# Patient Record
Sex: Male | Born: 1940 | Race: White | Hispanic: No | Marital: Single | Smoking: Former smoker
Health system: Southern US, Community
[De-identification: ages and names within clinical notes are randomized; demographics above are authoritative.]

## PROBLEM LIST (undated history)

## (undated) DIAGNOSIS — M1990 Unspecified osteoarthritis, unspecified site: Secondary | ICD-10-CM

## (undated) DIAGNOSIS — G459 Transient cerebral ischemic attack, unspecified: Secondary | ICD-10-CM

## (undated) DIAGNOSIS — G4700 Insomnia, unspecified: Secondary | ICD-10-CM

## (undated) DIAGNOSIS — I6529 Occlusion and stenosis of unspecified carotid artery: Secondary | ICD-10-CM

## (undated) DIAGNOSIS — I639 Cerebral infarction, unspecified: Secondary | ICD-10-CM

## (undated) DIAGNOSIS — K759 Inflammatory liver disease, unspecified: Secondary | ICD-10-CM

## (undated) DIAGNOSIS — E7800 Pure hypercholesterolemia, unspecified: Secondary | ICD-10-CM

## (undated) DIAGNOSIS — M109 Gout, unspecified: Secondary | ICD-10-CM

## (undated) DIAGNOSIS — I10 Essential (primary) hypertension: Secondary | ICD-10-CM

## (undated) DIAGNOSIS — S22000A Wedge compression fracture of unspecified thoracic vertebra, initial encounter for closed fracture: Secondary | ICD-10-CM

## (undated) DIAGNOSIS — M810 Age-related osteoporosis without current pathological fracture: Secondary | ICD-10-CM

## (undated) DIAGNOSIS — I099 Rheumatic heart disease, unspecified: Secondary | ICD-10-CM

## (undated) DIAGNOSIS — G43909 Migraine, unspecified, not intractable, without status migrainosus: Secondary | ICD-10-CM

## (undated) DIAGNOSIS — K219 Gastro-esophageal reflux disease without esophagitis: Secondary | ICD-10-CM

## (undated) DIAGNOSIS — G629 Polyneuropathy, unspecified: Secondary | ICD-10-CM

## (undated) DIAGNOSIS — J449 Chronic obstructive pulmonary disease, unspecified: Secondary | ICD-10-CM

## (undated) DIAGNOSIS — K56609 Unspecified intestinal obstruction, unspecified as to partial versus complete obstruction: Secondary | ICD-10-CM

## (undated) HISTORY — DX: Unspecified osteoarthritis, unspecified site: M19.90

## (undated) HISTORY — PX: ENDARTERECTOMY: SHX5162

## (undated) HISTORY — DX: Pure hypercholesterolemia, unspecified: E78.00

## (undated) HISTORY — DX: Insomnia, unspecified: G47.00

## (undated) HISTORY — DX: Rheumatic heart disease, unspecified: I09.9

## (undated) HISTORY — DX: Migraine, unspecified, not intractable, without status migrainosus: G43.909

## (undated) HISTORY — DX: Age-related osteoporosis without current pathological fracture: M81.0

## (undated) HISTORY — DX: Chronic obstructive pulmonary disease, unspecified: J44.9

## (undated) HISTORY — DX: Essential (primary) hypertension: I10

## (undated) HISTORY — DX: Unspecified intestinal obstruction, unspecified as to partial versus complete obstruction: K56.609

## (undated) HISTORY — DX: Wedge compression fracture of unspecified thoracic vertebra, initial encounter for closed fracture: S22.000A

## (undated) HISTORY — DX: Gastro-esophageal reflux disease without esophagitis: K21.9

## (undated) HISTORY — PX: TONSILLECTOMY: SUR1361

## (undated) HISTORY — DX: Cerebral infarction, unspecified: I63.9

## (undated) HISTORY — DX: Polyneuropathy, unspecified: G62.9

## (undated) HISTORY — DX: Gout, unspecified: M10.9

## (undated) HISTORY — DX: Inflammatory liver disease, unspecified: K75.9

---

## 2004-07-06 DIAGNOSIS — I639 Cerebral infarction, unspecified: Secondary | ICD-10-CM

## 2004-07-06 HISTORY — DX: Cerebral infarction, unspecified: I63.9

## 2004-07-20 ENCOUNTER — Ambulatory Visit: Payer: Self-pay | Admitting: Physical Medicine & Rehabilitation

## 2004-07-20 ENCOUNTER — Inpatient Hospital Stay (HOSPITAL_COMMUNITY)
Admission: RE | Admit: 2004-07-20 | Discharge: 2004-08-10 | Payer: Self-pay | Admitting: Physical Medicine & Rehabilitation

## 2004-08-13 ENCOUNTER — Encounter
Admission: RE | Admit: 2004-08-13 | Discharge: 2004-11-11 | Payer: Self-pay | Admitting: Physical Medicine & Rehabilitation

## 2004-09-14 ENCOUNTER — Encounter
Admission: RE | Admit: 2004-09-14 | Discharge: 2004-12-13 | Payer: Self-pay | Admitting: Physical Medicine & Rehabilitation

## 2004-09-17 ENCOUNTER — Ambulatory Visit: Payer: Self-pay | Admitting: Physical Medicine & Rehabilitation

## 2004-10-10 ENCOUNTER — Ambulatory Visit: Payer: Self-pay | Admitting: Psychology

## 2004-11-14 ENCOUNTER — Ambulatory Visit: Payer: Self-pay | Admitting: Physical Medicine & Rehabilitation

## 2005-01-28 ENCOUNTER — Ambulatory Visit: Payer: Self-pay | Admitting: Internal Medicine

## 2005-02-05 ENCOUNTER — Ambulatory Visit: Payer: Self-pay | Admitting: Cardiology

## 2005-02-12 ENCOUNTER — Encounter
Admission: RE | Admit: 2005-02-12 | Discharge: 2005-05-13 | Payer: Self-pay | Admitting: Physical Medicine & Rehabilitation

## 2005-02-15 ENCOUNTER — Ambulatory Visit: Payer: Self-pay | Admitting: Cardiology

## 2005-03-15 ENCOUNTER — Ambulatory Visit: Payer: Self-pay | Admitting: Cardiology

## 2005-05-29 ENCOUNTER — Encounter
Admission: RE | Admit: 2005-05-29 | Discharge: 2005-08-27 | Payer: Self-pay | Admitting: Physical Medicine & Rehabilitation

## 2005-05-29 ENCOUNTER — Ambulatory Visit: Payer: Self-pay | Admitting: Physical Medicine & Rehabilitation

## 2005-10-02 ENCOUNTER — Encounter
Admission: RE | Admit: 2005-10-02 | Discharge: 2005-12-31 | Payer: Self-pay | Admitting: Physical Medicine & Rehabilitation

## 2005-10-02 ENCOUNTER — Ambulatory Visit: Payer: Self-pay | Admitting: Physical Medicine & Rehabilitation

## 2005-10-28 DIAGNOSIS — R569 Unspecified convulsions: Secondary | ICD-10-CM

## 2005-10-28 HISTORY — DX: Unspecified convulsions: R56.9

## 2005-12-19 ENCOUNTER — Ambulatory Visit: Payer: Self-pay | Admitting: Cardiology

## 2006-05-20 ENCOUNTER — Inpatient Hospital Stay (HOSPITAL_COMMUNITY): Admission: AD | Admit: 2006-05-20 | Discharge: 2006-05-22 | Payer: Self-pay | Admitting: Pediatrics

## 2006-05-20 ENCOUNTER — Encounter: Payer: Self-pay | Admitting: Emergency Medicine

## 2006-05-21 ENCOUNTER — Ambulatory Visit: Payer: Self-pay | Admitting: Cardiology

## 2015-08-12 DIAGNOSIS — G459 Transient cerebral ischemic attack, unspecified: Secondary | ICD-10-CM

## 2015-08-12 HISTORY — DX: Transient cerebral ischemic attack, unspecified: G45.9

## 2015-10-27 ENCOUNTER — Encounter: Payer: Self-pay | Admitting: Neurology

## 2015-10-27 ENCOUNTER — Ambulatory Visit (INDEPENDENT_AMBULATORY_CARE_PROVIDER_SITE_OTHER): Payer: Medicare HMO | Admitting: Neurology

## 2015-10-27 VITALS — BP 138/78 | HR 62 | Ht 72.0 in | Wt 149.1 lb

## 2015-10-27 DIAGNOSIS — H53461 Homonymous bilateral field defects, right side: Secondary | ICD-10-CM

## 2015-10-27 DIAGNOSIS — R203 Hyperesthesia: Secondary | ICD-10-CM

## 2015-10-27 DIAGNOSIS — M79671 Pain in right foot: Secondary | ICD-10-CM | POA: Diagnosis not present

## 2015-10-27 NOTE — Progress Notes (Signed)
Note routed

## 2015-10-27 NOTE — Progress Notes (Signed)
NEUROLOGY CONSULTATION NOTE  Miguel Daniels MRN: [id number] DOB: January 18, 1941  Referring provider: Dr. Woody Seller Primary care provider: Dr. 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  History obtained by patient, his sister and PCP note.  Lumbar MRI report and labs reviewed.  He has history of hemorrhagic stroke involving the left hemisphere.  He has residual hyperesthesia involving the right side of face, arm and leg.  Gradually, over the past several months, he has had increased pain in the right foot.  When he bears weight on it, he notes a sharp aching pain.  There is no associated burning.  He initially had some back pain but denies shooting pain down the leg.  When he walks, he walks on his heel.  He also notes that his right big toe is extended at times.  He saw an orthopedist who had an MRI of the lumbar spine performed earlier this month.  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  The orthopedist suggested trying an epidural to see if it helped with the pain.  He has not seen a podiatrist recently.  Recent CBC, CMP and TSH were unremarkable.  PAST MEDICAL HISTORY: Past Medical History  Diagnosis Date  . High blood pressure   . Stroke (cerebrum) ([place])     PAST SURGICAL HISTORY: No past surgical history on file.  MEDICATIONS: No current outpatient prescriptions on file prior to visit.   No current facility-administered medications on file prior to visit.    ALLERGIES: Allergies  Allergen Reactions  . Latex Hives    FAMILY HISTORY: Family History  Problem Relation Age of Onset  . High blood pressure Sister      SOCIAL HISTORY: Social History   Social History  . Marital Status: Single    Spouse Name: N/A  . Number of Children: N/A  . Years of Education: N/A   Occupational History  . Not on file.   Social History Main Topics  . Smoking status: Former Research scientist (life sciences)  . Smokeless tobacco: Never Used  . Alcohol Use: No  . Drug Use: No  . Sexual Activity: Not on file   Other Topics Concern  . Not on file   Social History Narrative   Lives with sister but stays with his friend on the weekends.  Has no children.  Retired from several different jobs.    REVIEW OF SYSTEMS: Constitutional: No fevers, chills, or sweats, no generalized fatigue, change in appetite Eyes: No visual changes, double vision, eye pain Ear, nose and throat: No hearing loss, ear pain, nasal congestion, sore throat Cardiovascular: No chest pain, palpitations Respiratory:  No shortness of breath at rest or with exertion, wheezes GastrointestinaI: No nausea, vomiting, diarrhea, abdominal pain, fecal incontinence Genitourinary:  No dysuria, urinary retention or frequency Musculoskeletal:  No neck pain, back pain Integumentary: No rash, pruritus, skin lesions Neurological: as above Psychiatric: No depression, insomnia, anxiety Endocrine: No palpitations, fatigue, diaphoresis, mood swings, change in appetite, change in weight, increased thirst Hematologic/Lymphatic:  No anemia, purpura, petechiae. Allergic/Immunologic: no itchy/runny eyes, nasal congestion, recent allergic reactions, rashes  PHYSICAL EXAM: Filed Vitals:   10/27/15 1418  BP: 138/78  Pulse:  62   General: No acute distress.   Head:  Normocephalic/atraumatic Eyes:  fundi unremarkable, without vessel changes, exudates, hemorrhages or papilledema. Neck: supple, no paraspinal tenderness, full range of motion Back: No paraspinal tenderness Heart: regular rate and rhythm Lungs: Clear to auscultation bilaterally. Vascular: No carotid bruits. Neurological  Exam: Mental status: alert and oriented to person, place, and time, recent and remote memory intact, fund of knowledge intact, attention and concentration intact, speech fluent and not dysarthric, language intact. Cranial nerves: CN I: not tested CN II: pupils equal, round and reactive to light, right homonymous hemianopsia, fundi unremarkable, without vessel changes, exudates, hemorrhages or papilledema. CN III, IV, VI:  full range of motion, no nystagmus, no ptosis CN V: facial sensation intact CN VII: upper and lower face symmetric CN VIII: hearing intact CN IX, X: gag intact, uvula midline CN XI: sternocleidomastoid and trapezius muscles intact CN XII: tongue midline Bulk & Tone: normal, no fasciculations. Motor:  5/5 throughout  Sensation:  Hyperesthesia to pinprick in right upper and lower extremities. Vibration sensation intact. Deep Tendon Reflexes:  2+ throughout, toes downgoing.  Right first toe with some occasional extensor posturing Finger to nose testing:  Without dysmetria.  Heel to shin:  Without dysmetria.  Gait:  Limping and standing on heel of right foot due to pain.  Unable to tandem walk.  Able to stand on toes. Romberg negative.  IMPRESSION: Right foot pain with extensor posturing of big toe.  I think the toe is likely a pain response.  I don't suspect that it is a dystonia. 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  Recommend evaluation by podiatry.  If there isn't any clear diagnosis that would change treatment, then he would need pain management (he can begin with trying the epidural). 2.  No follow up needed.  Thank you for allowing me to take part in the care of this patient.  Metta Clines, DO  CC:  Jerene Bears, MD

## 2015-10-27 NOTE — Patient Instructions (Signed)
I don't suspect any brain, spinal cord or muscle disease.  I would seek out opinion from a podiatrist.  Otherwise, it will involve pain control (such as trying the epidural)

## 2016-11-05 DIAGNOSIS — I10 Essential (primary) hypertension: Secondary | ICD-10-CM | POA: Diagnosis not present

## 2016-11-05 DIAGNOSIS — G4700 Insomnia, unspecified: Secondary | ICD-10-CM | POA: Diagnosis not present

## 2016-11-05 DIAGNOSIS — J309 Allergic rhinitis, unspecified: Secondary | ICD-10-CM | POA: Diagnosis not present

## 2016-11-05 DIAGNOSIS — Z299 Encounter for prophylactic measures, unspecified: Secondary | ICD-10-CM | POA: Diagnosis not present

## 2016-11-05 DIAGNOSIS — J449 Chronic obstructive pulmonary disease, unspecified: Secondary | ICD-10-CM | POA: Diagnosis not present

## 2016-12-13 DIAGNOSIS — R062 Wheezing: Secondary | ICD-10-CM | POA: Diagnosis not present

## 2016-12-31 DIAGNOSIS — H40013 Open angle with borderline findings, low risk, bilateral: Secondary | ICD-10-CM | POA: Diagnosis not present

## 2016-12-31 DIAGNOSIS — H04123 Dry eye syndrome of bilateral lacrimal glands: Secondary | ICD-10-CM | POA: Diagnosis not present

## 2016-12-31 DIAGNOSIS — H53469 Homonymous bilateral field defects, unspecified side: Secondary | ICD-10-CM | POA: Diagnosis not present

## 2017-01-21 DIAGNOSIS — Z299 Encounter for prophylactic measures, unspecified: Secondary | ICD-10-CM | POA: Diagnosis not present

## 2017-01-21 DIAGNOSIS — J449 Chronic obstructive pulmonary disease, unspecified: Secondary | ICD-10-CM | POA: Diagnosis not present

## 2017-01-21 DIAGNOSIS — Z713 Dietary counseling and surveillance: Secondary | ICD-10-CM | POA: Diagnosis not present

## 2017-01-21 DIAGNOSIS — Z6820 Body mass index (BMI) 20.0-20.9, adult: Secondary | ICD-10-CM | POA: Diagnosis not present

## 2017-01-22 DIAGNOSIS — G8929 Other chronic pain: Secondary | ICD-10-CM | POA: Diagnosis not present

## 2017-01-22 DIAGNOSIS — G890 Central pain syndrome: Secondary | ICD-10-CM | POA: Diagnosis not present

## 2017-01-22 DIAGNOSIS — G894 Chronic pain syndrome: Secondary | ICD-10-CM | POA: Diagnosis not present

## 2017-02-11 DIAGNOSIS — I10 Essential (primary) hypertension: Secondary | ICD-10-CM | POA: Diagnosis not present

## 2017-02-11 DIAGNOSIS — M25532 Pain in left wrist: Secondary | ICD-10-CM | POA: Diagnosis not present

## 2017-02-11 DIAGNOSIS — J449 Chronic obstructive pulmonary disease, unspecified: Secondary | ICD-10-CM | POA: Diagnosis not present

## 2017-02-11 DIAGNOSIS — Z6820 Body mass index (BMI) 20.0-20.9, adult: Secondary | ICD-10-CM | POA: Diagnosis not present

## 2017-02-11 DIAGNOSIS — Z299 Encounter for prophylactic measures, unspecified: Secondary | ICD-10-CM | POA: Diagnosis not present

## 2017-02-19 DIAGNOSIS — L570 Actinic keratosis: Secondary | ICD-10-CM | POA: Diagnosis not present

## 2017-02-19 DIAGNOSIS — Z299 Encounter for prophylactic measures, unspecified: Secondary | ICD-10-CM | POA: Diagnosis not present

## 2017-02-19 DIAGNOSIS — Z6820 Body mass index (BMI) 20.0-20.9, adult: Secondary | ICD-10-CM | POA: Diagnosis not present

## 2017-02-19 DIAGNOSIS — Z87891 Personal history of nicotine dependence: Secondary | ICD-10-CM | POA: Diagnosis not present

## 2017-03-27 DIAGNOSIS — K219 Gastro-esophageal reflux disease without esophagitis: Secondary | ICD-10-CM | POA: Diagnosis not present

## 2017-03-27 DIAGNOSIS — I639 Cerebral infarction, unspecified: Secondary | ICD-10-CM | POA: Diagnosis not present

## 2017-03-27 DIAGNOSIS — Z6820 Body mass index (BMI) 20.0-20.9, adult: Secondary | ICD-10-CM | POA: Diagnosis not present

## 2017-03-27 DIAGNOSIS — I10 Essential (primary) hypertension: Secondary | ICD-10-CM | POA: Diagnosis not present

## 2017-03-27 DIAGNOSIS — R42 Dizziness and giddiness: Secondary | ICD-10-CM | POA: Diagnosis not present

## 2017-03-27 DIAGNOSIS — Z713 Dietary counseling and surveillance: Secondary | ICD-10-CM | POA: Diagnosis not present

## 2017-03-27 DIAGNOSIS — J449 Chronic obstructive pulmonary disease, unspecified: Secondary | ICD-10-CM | POA: Diagnosis not present

## 2017-03-27 DIAGNOSIS — E7800 Pure hypercholesterolemia, unspecified: Secondary | ICD-10-CM | POA: Diagnosis not present

## 2017-03-27 DIAGNOSIS — Z299 Encounter for prophylactic measures, unspecified: Secondary | ICD-10-CM | POA: Diagnosis not present

## 2017-03-28 DIAGNOSIS — R42 Dizziness and giddiness: Secondary | ICD-10-CM | POA: Diagnosis not present

## 2017-03-28 DIAGNOSIS — K219 Gastro-esophageal reflux disease without esophagitis: Secondary | ICD-10-CM | POA: Diagnosis not present

## 2017-03-28 DIAGNOSIS — Z713 Dietary counseling and surveillance: Secondary | ICD-10-CM | POA: Diagnosis not present

## 2017-03-28 DIAGNOSIS — I639 Cerebral infarction, unspecified: Secondary | ICD-10-CM | POA: Diagnosis not present

## 2017-03-28 DIAGNOSIS — J449 Chronic obstructive pulmonary disease, unspecified: Secondary | ICD-10-CM | POA: Diagnosis not present

## 2017-03-28 DIAGNOSIS — I10 Essential (primary) hypertension: Secondary | ICD-10-CM | POA: Diagnosis not present

## 2017-03-28 DIAGNOSIS — Z6820 Body mass index (BMI) 20.0-20.9, adult: Secondary | ICD-10-CM | POA: Diagnosis not present

## 2017-03-28 DIAGNOSIS — E7800 Pure hypercholesterolemia, unspecified: Secondary | ICD-10-CM | POA: Diagnosis not present

## 2017-03-28 DIAGNOSIS — Z299 Encounter for prophylactic measures, unspecified: Secondary | ICD-10-CM | POA: Diagnosis not present

## 2017-04-25 DIAGNOSIS — Z681 Body mass index (BMI) 19 or less, adult: Secondary | ICD-10-CM | POA: Diagnosis not present

## 2017-04-25 DIAGNOSIS — M109 Gout, unspecified: Secondary | ICD-10-CM | POA: Diagnosis not present

## 2017-04-25 DIAGNOSIS — Z299 Encounter for prophylactic measures, unspecified: Secondary | ICD-10-CM | POA: Diagnosis not present

## 2017-04-25 DIAGNOSIS — I10 Essential (primary) hypertension: Secondary | ICD-10-CM | POA: Diagnosis not present

## 2017-04-25 DIAGNOSIS — E7800 Pure hypercholesterolemia, unspecified: Secondary | ICD-10-CM | POA: Diagnosis not present

## 2017-04-25 DIAGNOSIS — M25532 Pain in left wrist: Secondary | ICD-10-CM | POA: Diagnosis not present

## 2017-04-25 DIAGNOSIS — Z713 Dietary counseling and surveillance: Secondary | ICD-10-CM | POA: Diagnosis not present

## 2017-04-25 DIAGNOSIS — J449 Chronic obstructive pulmonary disease, unspecified: Secondary | ICD-10-CM | POA: Diagnosis not present

## 2017-04-29 DIAGNOSIS — Z299 Encounter for prophylactic measures, unspecified: Secondary | ICD-10-CM | POA: Diagnosis not present

## 2017-04-29 DIAGNOSIS — M25542 Pain in joints of left hand: Secondary | ICD-10-CM | POA: Diagnosis not present

## 2017-04-29 DIAGNOSIS — M19032 Primary osteoarthritis, left wrist: Secondary | ICD-10-CM | POA: Diagnosis not present

## 2017-04-29 DIAGNOSIS — Z681 Body mass index (BMI) 19 or less, adult: Secondary | ICD-10-CM | POA: Diagnosis not present

## 2017-04-29 DIAGNOSIS — J449 Chronic obstructive pulmonary disease, unspecified: Secondary | ICD-10-CM | POA: Diagnosis not present

## 2017-04-29 DIAGNOSIS — E7800 Pure hypercholesterolemia, unspecified: Secondary | ICD-10-CM | POA: Diagnosis not present

## 2017-04-29 DIAGNOSIS — M79642 Pain in left hand: Secondary | ICD-10-CM | POA: Diagnosis not present

## 2017-04-29 DIAGNOSIS — Z713 Dietary counseling and surveillance: Secondary | ICD-10-CM | POA: Diagnosis not present

## 2017-04-29 DIAGNOSIS — M19042 Primary osteoarthritis, left hand: Secondary | ICD-10-CM | POA: Diagnosis not present

## 2017-04-29 DIAGNOSIS — I639 Cerebral infarction, unspecified: Secondary | ICD-10-CM | POA: Diagnosis not present

## 2017-04-29 DIAGNOSIS — I10 Essential (primary) hypertension: Secondary | ICD-10-CM | POA: Diagnosis not present

## 2017-05-26 DIAGNOSIS — S638X2A Sprain of other part of left wrist and hand, initial encounter: Secondary | ICD-10-CM | POA: Diagnosis not present

## 2017-05-26 DIAGNOSIS — M7989 Other specified soft tissue disorders: Secondary | ICD-10-CM | POA: Diagnosis not present

## 2017-06-17 DIAGNOSIS — E7800 Pure hypercholesterolemia, unspecified: Secondary | ICD-10-CM | POA: Diagnosis not present

## 2017-06-17 DIAGNOSIS — Z0000 Encounter for general adult medical examination without abnormal findings: Secondary | ICD-10-CM | POA: Diagnosis not present

## 2017-06-17 DIAGNOSIS — Z79899 Other long term (current) drug therapy: Secondary | ICD-10-CM | POA: Diagnosis not present

## 2017-06-17 DIAGNOSIS — Z681 Body mass index (BMI) 19 or less, adult: Secondary | ICD-10-CM | POA: Diagnosis not present

## 2017-06-17 DIAGNOSIS — I10 Essential (primary) hypertension: Secondary | ICD-10-CM | POA: Diagnosis not present

## 2017-06-17 DIAGNOSIS — Z299 Encounter for prophylactic measures, unspecified: Secondary | ICD-10-CM | POA: Diagnosis not present

## 2017-06-17 DIAGNOSIS — J449 Chronic obstructive pulmonary disease, unspecified: Secondary | ICD-10-CM | POA: Diagnosis not present

## 2017-06-17 DIAGNOSIS — Z1389 Encounter for screening for other disorder: Secondary | ICD-10-CM | POA: Diagnosis not present

## 2017-06-17 DIAGNOSIS — R5383 Other fatigue: Secondary | ICD-10-CM | POA: Diagnosis not present

## 2017-06-17 DIAGNOSIS — Z125 Encounter for screening for malignant neoplasm of prostate: Secondary | ICD-10-CM | POA: Diagnosis not present

## 2017-06-17 DIAGNOSIS — Z7189 Other specified counseling: Secondary | ICD-10-CM | POA: Diagnosis not present

## 2017-06-25 DIAGNOSIS — L820 Inflamed seborrheic keratosis: Secondary | ICD-10-CM | POA: Diagnosis not present

## 2017-06-25 DIAGNOSIS — Z681 Body mass index (BMI) 19 or less, adult: Secondary | ICD-10-CM | POA: Diagnosis not present

## 2017-06-25 DIAGNOSIS — Z299 Encounter for prophylactic measures, unspecified: Secondary | ICD-10-CM | POA: Diagnosis not present

## 2017-07-02 DIAGNOSIS — H35363 Drusen (degenerative) of macula, bilateral: Secondary | ICD-10-CM | POA: Diagnosis not present

## 2017-07-02 DIAGNOSIS — H25043 Posterior subcapsular polar age-related cataract, bilateral: Secondary | ICD-10-CM | POA: Diagnosis not present

## 2017-07-02 DIAGNOSIS — H35033 Hypertensive retinopathy, bilateral: Secondary | ICD-10-CM | POA: Diagnosis not present

## 2017-07-02 DIAGNOSIS — H40013 Open angle with borderline findings, low risk, bilateral: Secondary | ICD-10-CM | POA: Diagnosis not present

## 2018-06-23 DIAGNOSIS — Z7189 Other specified counseling: Secondary | ICD-10-CM | POA: Diagnosis not present

## 2018-06-23 DIAGNOSIS — E7800 Pure hypercholesterolemia, unspecified: Secondary | ICD-10-CM | POA: Diagnosis not present

## 2018-06-23 DIAGNOSIS — R5383 Other fatigue: Secondary | ICD-10-CM | POA: Diagnosis not present

## 2018-06-23 DIAGNOSIS — Z79899 Other long term (current) drug therapy: Secondary | ICD-10-CM | POA: Diagnosis not present

## 2018-06-23 DIAGNOSIS — Z1339 Encounter for screening examination for other mental health and behavioral disorders: Secondary | ICD-10-CM | POA: Diagnosis not present

## 2018-06-23 DIAGNOSIS — Z125 Encounter for screening for malignant neoplasm of prostate: Secondary | ICD-10-CM | POA: Diagnosis not present

## 2018-06-23 DIAGNOSIS — Z0000 Encounter for general adult medical examination without abnormal findings: Secondary | ICD-10-CM | POA: Diagnosis not present

## 2018-06-23 DIAGNOSIS — Z1331 Encounter for screening for depression: Secondary | ICD-10-CM | POA: Diagnosis not present

## 2018-06-23 DIAGNOSIS — Z1211 Encounter for screening for malignant neoplasm of colon: Secondary | ICD-10-CM | POA: Diagnosis not present

## 2018-06-23 DIAGNOSIS — M25559 Pain in unspecified hip: Secondary | ICD-10-CM | POA: Diagnosis not present

## 2018-06-23 DIAGNOSIS — Z681 Body mass index (BMI) 19 or less, adult: Secondary | ICD-10-CM | POA: Diagnosis not present

## 2018-06-23 DIAGNOSIS — Z299 Encounter for prophylactic measures, unspecified: Secondary | ICD-10-CM | POA: Diagnosis not present

## 2018-07-01 ENCOUNTER — Ambulatory Visit (INDEPENDENT_AMBULATORY_CARE_PROVIDER_SITE_OTHER): Payer: PPO | Admitting: Orthopaedic Surgery

## 2018-07-01 ENCOUNTER — Ambulatory Visit (INDEPENDENT_AMBULATORY_CARE_PROVIDER_SITE_OTHER): Payer: Self-pay

## 2018-07-01 ENCOUNTER — Encounter (INDEPENDENT_AMBULATORY_CARE_PROVIDER_SITE_OTHER): Payer: Self-pay | Admitting: Orthopaedic Surgery

## 2018-07-01 VITALS — BP 182/88 | HR 60 | Ht 71.0 in | Wt 143.0 lb

## 2018-07-01 DIAGNOSIS — M25551 Pain in right hip: Secondary | ICD-10-CM

## 2018-07-01 DIAGNOSIS — M25552 Pain in left hip: Secondary | ICD-10-CM | POA: Diagnosis not present

## 2018-07-01 NOTE — Progress Notes (Signed)
Office Visit Note   Patient: Miguel Daniels           Date of Birth: 1941/08/10           MRN: [id number] Visit Date: 07/01/2018              Requested by: Glenda Chroman, MD [place], [place] [address] PCP: Glenda Chroman, MD   Assessment & Plan: Visit Diagnoses:  1. Pain in left hip   2. Pain in right hip     Plan: Mild arthritis right hip which may be causing some trouble.  I think hamstring tightness is also contributing to his problem.  We will send to physical therapy 1 time per stretching exercises.  We will see back as needed.  I do not think his back is a problem at this point.  This visit over half an hour 50% of the time in counseling  Follow-Up Instructions: No follow-ups on file.   Orders:  Orders Placed This Encounter  Procedures  . XR HIP UNILAT W OR W/O PELVIS 2-3 VIEWS RIGHT  . XR HIP UNILAT W OR W/O PELVIS 2-3 VIEWS LEFT   No orders of the defined types were placed in this encounter.     Procedures: No procedures performed   Clinical Data: No additional findings.   Subjective: Chief Complaint  Patient presents with  . New Patient (Initial Visit)    BIL LAT HIP PAIN 14 YRS JUST GETTING WORSE WHEN PT SITS DOWN HIPS POP OUT  Miguel Daniels is accompanied by his wife and is here for evaluation of what he perceives as an increasing problem with his back and right lower extremity.  He had a stroke 14 years ago and is still having some residual a aphasia.  According to his wife he has a hard time describing "things".  He thinks that he is having a little bit more trouble with his right leg as he seems to be no new injury or trauma.  No additional strokes.  He has a hard time describing his "discomfort. In the past he has had MRI scans of his lumbar spine.  The last was in 2016 demonstrating right-sided abnormality at L5-S1 with a broad-based disc protrusion, disc space narrowing and bony overgrowth resulting in right subarticular zone and foraminal zone narrowing  potentially affecting the L5 and S1 nerve roots.  Has had prior right hip films without acute abnormality in 2015 and a pelvic CT in 2014 without abnormality He remains quite active and perform stretching exercises on a daily basis.  Experienced any ecchymosis or swelling recently.  He is on baclofen for spasm HPI  Review of Systems  Constitutional: Positive for fatigue.  HENT: Negative for ear pain.   Eyes: Negative for pain.  Respiratory: Negative for cough and shortness of breath.   Cardiovascular: Negative for leg swelling.  Gastrointestinal: Negative for constipation and diarrhea.  Genitourinary: Negative for difficulty urinating.  Musculoskeletal: Positive for back pain. Negative for neck pain.  Skin: Negative for rash.  Allergic/Immunologic: Negative for food allergies.  Neurological: Positive for weakness. Negative for numbness.  Hematological: Does not bruise/bleed easily.  Psychiatric/Behavioral: Negative for sleep disturbance.     Objective: Vital Signs: BP (!) 182/88 (BP Location: Left Arm, Patient Position: Sitting, Cuff Size: Normal)   Pulse 60   Ht 5\' 11"  (1.803 m)   Wt 143 lb (64.9 kg)   BMI 19.94 kg/m   Physical Exam  Constitutional: He appears well-developed.  Pulmonary/Chest: Effort normal.  Neurological: He is alert.  Skin: Skin is warm.    Ortho Exam awake and alert.  Some of his responses were difficult to interpret as suggested by his wife with his residual a aphasia.  Walks without any ambulatory aid.  He does have mild spasticity of his right lower extremity.  Reflexes were slightly more brisk on the right than they were in the left leg.  His hamstrings were tight right lower extremity.  Still edema.  No percussible tenderness of the lumbar spine.  Some rigidity with motion of his right hip but no pain.  No pain or LOC loss of motion on the left.  Specialty Comments:  No specialty comments available.  Imaging: No results found.   PMFS  History: There are no active problems to display for this patient.  Past Medical History:  Diagnosis Date  . Acid reflux   . Arthritis   . Hepatitis   . High blood pressure   . Migraines   . Osteoporosis   . Stroke (cerebrum) [facility])     Family History  Problem Relation Age of Onset  . High blood pressure Sister     History reviewed. No pertinent surgical history. Social History   Occupational History  . Not on file  Tobacco Use  . Smoking status: Former Research scientist (life sciences)  . Smokeless tobacco: Never Used  Substance and Sexual Activity  . Alcohol use: No    Alcohol/week: 0.0 standard drinks  . Drug use: No  . Sexual activity: Not on file

## 2018-07-07 DIAGNOSIS — M25559 Pain in unspecified hip: Secondary | ICD-10-CM | POA: Diagnosis not present

## 2018-07-08 DIAGNOSIS — H35363 Drusen (degenerative) of macula, bilateral: Secondary | ICD-10-CM | POA: Diagnosis not present

## 2018-07-08 DIAGNOSIS — H25013 Cortical age-related cataract, bilateral: Secondary | ICD-10-CM | POA: Diagnosis not present

## 2018-07-08 DIAGNOSIS — H2513 Age-related nuclear cataract, bilateral: Secondary | ICD-10-CM | POA: Diagnosis not present

## 2018-07-08 DIAGNOSIS — H40013 Open angle with borderline findings, low risk, bilateral: Secondary | ICD-10-CM | POA: Diagnosis not present

## 2018-07-08 DIAGNOSIS — H35033 Hypertensive retinopathy, bilateral: Secondary | ICD-10-CM | POA: Diagnosis not present

## 2018-07-17 DIAGNOSIS — H04122 Dry eye syndrome of left lacrimal gland: Secondary | ICD-10-CM | POA: Diagnosis not present

## 2018-07-17 DIAGNOSIS — H04121 Dry eye syndrome of right lacrimal gland: Secondary | ICD-10-CM | POA: Diagnosis not present

## 2018-08-11 DIAGNOSIS — R4182 Altered mental status, unspecified: Secondary | ICD-10-CM | POA: Diagnosis not present

## 2018-08-11 DIAGNOSIS — R4020 Unspecified coma: Secondary | ICD-10-CM | POA: Diagnosis not present

## 2018-08-11 DIAGNOSIS — R404 Transient alteration of awareness: Secondary | ICD-10-CM | POA: Diagnosis not present

## 2018-08-11 DIAGNOSIS — I6523 Occlusion and stenosis of bilateral carotid arteries: Secondary | ICD-10-CM | POA: Diagnosis not present

## 2018-08-11 DIAGNOSIS — R0689 Other abnormalities of breathing: Secondary | ICD-10-CM | POA: Diagnosis not present

## 2018-08-11 DIAGNOSIS — I10 Essential (primary) hypertension: Secondary | ICD-10-CM | POA: Diagnosis not present

## 2018-08-11 DIAGNOSIS — Z8673 Personal history of transient ischemic attack (TIA), and cerebral infarction without residual deficits: Secondary | ICD-10-CM | POA: Diagnosis not present

## 2018-08-11 DIAGNOSIS — J984 Other disorders of lung: Secondary | ICD-10-CM | POA: Diagnosis not present

## 2018-08-11 DIAGNOSIS — I499 Cardiac arrhythmia, unspecified: Secondary | ICD-10-CM | POA: Diagnosis not present

## 2018-08-11 DIAGNOSIS — Z87891 Personal history of nicotine dependence: Secondary | ICD-10-CM | POA: Diagnosis not present

## 2018-08-11 DIAGNOSIS — Z79899 Other long term (current) drug therapy: Secondary | ICD-10-CM | POA: Diagnosis not present

## 2018-08-11 DIAGNOSIS — Z7982 Long term (current) use of aspirin: Secondary | ICD-10-CM | POA: Diagnosis not present

## 2018-08-11 DIAGNOSIS — G459 Transient cerebral ischemic attack, unspecified: Secondary | ICD-10-CM | POA: Diagnosis not present

## 2018-08-11 DIAGNOSIS — R4701 Aphasia: Secondary | ICD-10-CM | POA: Diagnosis not present

## 2018-08-12 DIAGNOSIS — G459 Transient cerebral ischemic attack, unspecified: Secondary | ICD-10-CM | POA: Diagnosis not present

## 2018-08-12 DIAGNOSIS — R4182 Altered mental status, unspecified: Secondary | ICD-10-CM | POA: Diagnosis not present

## 2018-08-12 DIAGNOSIS — Z8673 Personal history of transient ischemic attack (TIA), and cerebral infarction without residual deficits: Secondary | ICD-10-CM | POA: Diagnosis not present

## 2018-08-12 DIAGNOSIS — I10 Essential (primary) hypertension: Secondary | ICD-10-CM | POA: Diagnosis not present

## 2018-08-17 ENCOUNTER — Other Ambulatory Visit: Payer: Self-pay | Admitting: *Deleted

## 2018-08-17 NOTE — Patient Outreach (Signed)
[place] [facility]) Care Management  08/17/2018  Miguel Daniels [birth year] [id number]   Transition of Care Referral   Referral Date: 08/17/18 Referral Source: [facility]TA urgent outreach for [facility]  Date of Admission:  Diagnosis: unspecified abdominal pain  Date of Discharge:on 08/12/18 Facility: [facility]  Insurance: HTA  Outreach attempt # 1 No answer. [facility] RN CM left HIPAA compliant voicemail message along with CM's contact info.   Plan: [facility] RN CM sent an unsuccessful outreach letter and scheduled this patient for another call attempt within 4 business days  Kimberly L. Lavina Hamman, RN, BSN, [place] Management Care Coordinator Direct Number [phone] Mobile number [phone]  Main [facility] number [phone] Fax number [phone]

## 2018-08-18 ENCOUNTER — Ambulatory Visit: Payer: Self-pay | Admitting: *Deleted

## 2018-08-19 DIAGNOSIS — I10 Essential (primary) hypertension: Secondary | ICD-10-CM | POA: Diagnosis not present

## 2018-08-19 DIAGNOSIS — I6529 Occlusion and stenosis of unspecified carotid artery: Secondary | ICD-10-CM | POA: Diagnosis not present

## 2018-08-19 DIAGNOSIS — J449 Chronic obstructive pulmonary disease, unspecified: Secondary | ICD-10-CM | POA: Diagnosis not present

## 2018-08-19 DIAGNOSIS — G459 Transient cerebral ischemic attack, unspecified: Secondary | ICD-10-CM | POA: Diagnosis not present

## 2018-08-19 DIAGNOSIS — Z681 Body mass index (BMI) 19 or less, adult: Secondary | ICD-10-CM | POA: Diagnosis not present

## 2018-08-19 DIAGNOSIS — Z299 Encounter for prophylactic measures, unspecified: Secondary | ICD-10-CM | POA: Diagnosis not present

## 2018-08-20 ENCOUNTER — Other Ambulatory Visit: Payer: Self-pay | Admitting: *Deleted

## 2018-08-20 NOTE — Patient Outreach (Signed)
[place] [facility]) Care Management  08/20/2018  Miguel Daniels February 11, 1941 [id number]   Transition of Care Referral  Referral Date: 08/17/18 Referral Source: [facility]TA urgent outreach for [facility]  Date of Admission:  Diagnosis: unspecified abdominal pain  Date of Discharge:on 08/12/18 Facility: [facility]  Insurance: HTA  Outreach attempt # 2 No answer. [facility] RN CM unable to leave a HIPAA compliant voicemail message    Plan: [facility] RN CM scheduled this patient for another call attempt within 4 business days  Andreah Goheen L. Lavina Hamman, RN, BSN, [place] Management Care Coordinator Direct Number [phone] Mobile number [phone]  Main [facility] number [phone] Fax number [phone]

## 2018-08-24 ENCOUNTER — Other Ambulatory Visit: Payer: Self-pay | Admitting: *Deleted

## 2018-08-24 NOTE — Patient Outreach (Signed)
[place] [facility]) Care Management  08/24/2018  Miguel Daniels [birth year] [id number]   Transition of Care Referral  Referral Date:08/17/18 Referral Source:HTA urgent outreach for [facility] Date of Admission:  Diagnosis:unspecified abdominal pain Date of Discharge:on 08/12/18 [place]  Outreach attempt # 3 A male answered and stated [name] was not there. [facility] RN CM left a HIPAA compliant voicemail message to include CM number with this male   Plan: [facility] RN CM scheduled this patient for case closure within 4 business days  Jenney Brester L. Lavina Hamman, RN, BSN, [place] Management Care Coordinator Direct Number [phone] Mobile number [phone]  Main [facility] number [phone] Fax number [phone]

## 2018-08-31 ENCOUNTER — Encounter: Payer: Self-pay | Admitting: Vascular Surgery

## 2018-08-31 ENCOUNTER — Other Ambulatory Visit: Payer: Self-pay | Admitting: *Deleted

## 2018-08-31 ENCOUNTER — Ambulatory Visit (INDEPENDENT_AMBULATORY_CARE_PROVIDER_SITE_OTHER): Payer: PPO | Admitting: Vascular Surgery

## 2018-08-31 ENCOUNTER — Telehealth: Payer: Self-pay | Admitting: *Deleted

## 2018-08-31 VITALS — BP 187/89 | HR 65 | Temp 98.2°F | Resp 20 | Ht 71.0 in | Wt 144.4 lb

## 2018-08-31 DIAGNOSIS — I6522 Occlusion and stenosis of left carotid artery: Secondary | ICD-10-CM | POA: Diagnosis not present

## 2018-08-31 NOTE — H&P (View-Only) (Signed)
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 male, who is here today for evaluation of recent left brain event.  The prior history of intracranial bleed approximately 14 years ago.  He has a continuing issue regarding memory associated with this.  He has a friend with him who is very helpful in providing details as well.  He reportedly was found unresponsive by family and was taken to [facility] for further evaluation.  This was in mid October.  Apparently there was some slurred speech and right-sided weakness according to the notes from the hospital.  He reports that he is returned to his baseline.  Work-up at the hospital included duplex and also CT angiogram of his head and neck.  The CT and angiogram was available for my review.  He has no history of cardiac disease.  Past Medical History:  Diagnosis Date  . Acid reflux   . Arthritis   . Hepatitis   . High blood pressure   . Migraines   . Osteoporosis   . Stroke (cerebrum) [facility])     Family History  Problem Relation Age of Onset  . High blood pressure Sister     SOCIAL HISTORY: Social History   Socioeconomic History  . Marital status: Single    Spouse name: Not on file  . Number of children: Not on file  . Years of education: Not on file  . Highest education level: Not on file  Occupational History  . Not on file  Social Needs  . Financial resource strain: Not on file  . Food insecurity:    Worry: Not on file    Inability: Not on file  . Transportation needs:    Medical: Not on file    Non-medical: Not on file  Tobacco Use  . Smoking status: Former Research scientist (life sciences)  . Smokeless tobacco: Never Used  Substance and Sexual Activity  . Alcohol use: No    Alcohol/week: 0.0 standard drinks  . Drug use: No  . Sexual  activity: Not on file  Lifestyle  . Physical activity:    Days per week: Not on file    Minutes per session: Not on file  . Stress: Not on file  Relationships  . Social connections:    Talks on phone: Not on file    Gets together: Not on file    Attends religious service: Not on file    Active member of club or organization: Not on file    Attends meetings of clubs or organizations: Not on file    Relationship status: Not on file  . Intimate partner violence:    Fear of current or ex partner: Not on file    Emotionally abused: Not on file    Physically abused: Not on file    Forced sexual activity: Not on file  Other Topics Concern  . Not on file  Social History Narrative   Lives with sister but stays with his friend on the weekends.  Has no children.  Retired from several different jobs.    Allergies  Allergen Reactions  . Latex Hives    Current Outpatient Medications  Medication Sig Dispense Refill  . aspirin 81 MG chewable tablet Chew by mouth daily.    . baclofen (LIORESAL) 10 MG tablet Take 10 mg by mouth 3 (three)  times daily.  3  . BREO ELLIPTA 100-25 MCG/INH AEPB TAKE 1 PUFF BY MOUTH EVERY DAY  6  . Calcium Citrate 200 MG TABS Take 630 mg by mouth.    . diphenoxylate-atropine (LOMOTIL) 2.5-0.025 MG tablet TAKE 1 TABLET BY MOUTH EVERY DAY AS NEEDED FOR LOOS BM  1  . fexofenadine (ALLEGRA) 180 MG tablet Take 180 mg by mouth.    Marland Kitchen FLUoxetine (PROZAC) 20 MG capsule Take 20 mg by mouth.    . hydrochlorothiazide (HYDRODIURIL) 25 MG tablet Take 25 mg by mouth.    Marland Kitchen lisinopril (PRINIVIL,ZESTRIL) 40 MG tablet Take 40 mg by mouth.    . Magnesium 250 MG TABS Take by mouth.    . Omega-3 Fatty Acids (FISH OIL) 1000 MG CAPS Take 1 capsule by mouth.    . Red Yeast Rice 600 MG CAPS Take by mouth.    . traZODone (DESYREL) 50 MG tablet Take 50 mg by mouth.    . vitamin C (ASCORBIC ACID) 500 MG tablet Take 500 mg by mouth.     No current facility-administered medications for this  visit.     REVIEW OF SYSTEMS:  [X]  denotes positive finding, [ ]  denotes negative finding Cardiac  Comments:  Chest pain or chest pressure:    Shortness of breath upon exertion:    Short of breath when lying flat:    Irregular heart rhythm:        Vascular    Pain in calf, thigh, or hip brought on by ambulation:    Pain in feet at night that wakes you up from your sleep:     Blood clot in your veins:    Leg swelling:         Pulmonary    Oxygen at home:    Productive cough:     Wheezing:         Neurologic    Sudden weakness in arms or legs:  x   Sudden numbness in arms or legs:  x   Sudden onset of difficulty speaking or slurred speech: x   Temporary loss of vision in one eye:     Problems with dizziness:         Gastrointestinal    Blood in stool:     Vomited blood:         Genitourinary    Burning when urinating:     Blood in urine:        Psychiatric    Major depression:         Hematologic    Bleeding problems:    Problems with blood clotting too easily:        Skin    Rashes or ulcers:        Constitutional    Fever or chills:      PHYSICAL EXAM: Vitals:   08/31/18 1056  BP: (!) 187/89  Pulse: 65  Resp: 20  Temp: 98.2 F (36.8 C)  TempSrc: Temporal  Weight: 144 lb 6.4 oz (65.5 kg)  Height: 5\' 11"  (1.803 m)    GENERAL: The patient is a well-nourished male, in no acute distress. The vital signs are documented above. CARDIOVASCULAR: Carotid arteries without bruits bilaterally.  2+ radial pulses bilaterally PULMONARY: There is good air exchange  ABDOMEN: Soft and non-tender  MUSCULOSKELETAL: There are no major deformities or cyanosis. NEUROLOGIC: No focal weakness or paresthesias are detected. SKIN: There are no ulcers or rashes noted. PSYCHIATRIC: The patient has a normal affect.  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  Have a very  irregular plaque at the left carotid bifurcation  MEDICAL ISSUES: Because these findings at length with the patient and his friend present.  Have recommended carotid endarterectomy for reduction of stroke risk.  I explained the procedure of carotid endarterectomy to include 1 to 2% risk of stroke with surgery.  Also discussed potential for cranial nerve injury.  He understands and wished to proceed.  Understands this will be admitted the day of surgery with a planned overnight hospitalization and discharge to home the following day.   Rosetta Posner, MD FACS [facility] Tel [phone] Pager [phone]

## 2018-08-31 NOTE — Patient Outreach (Signed)
  [place] [facility]) Care Management  08/31/2018  Tao Satz Winker 02/02/1941 [id number]   Case closure   Call attempts made on 08/17/18, 08/20/18 and 08/24/18 Unsuccessful outreach letter sent on 08/20/18 without a response   Plan [facility] RN CM will close case after no response from patient within 10 business days. Unable to reach   [place]. Lavina Hamman, RN, BSN, [place] Coordinator Office number [phone] Mobile number [phone]  Main [facility] number [phone] Fax number [phone]

## 2018-08-31 NOTE — Telephone Encounter (Signed)
Call to Santiago Glad patient's caregiver. Instructed to be at [facility] admitting at 11:45 am on 09/04/18 for surgery. NPO past MN and expect a call and follow the detailed instructions received from the hospital pre-admission department for this surgery. Overnight stay. Continue aspirin. Call if any questions.

## 2018-08-31 NOTE — Progress Notes (Signed)
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 male, who is here today for evaluation of recent left brain event.  The prior history of intracranial bleed approximately 14 years ago.  He has a continuing issue regarding memory associated with this.  He has a friend with him who is very helpful in providing details as well.  He reportedly was found unresponsive by family and was taken to [facility] for further evaluation.  This was in mid October.  Apparently there was some slurred speech and right-sided weakness according to the notes from the hospital.  He reports that he is returned to his baseline.  Work-up at the hospital included duplex and also CT angiogram of his head and neck.  The CT and angiogram was available for Miguel review.  He has no history of cardiac disease.  Past Medical History:  Diagnosis Date  . Acid reflux   . Arthritis   . Hepatitis   . High blood pressure   . Migraines   . Osteoporosis   . Stroke (cerebrum) [facility])     Family History  Problem Relation Age of Onset  . High blood pressure Sister     SOCIAL HISTORY: Social History   Socioeconomic History  . Marital status: Single    Spouse name: Not on file  . Number of children: Not on file  . Years of education: Not on file  . Highest education level: Not on file  Occupational History  . Not on file  Social Needs  . Financial resource strain: Not on file  . Food insecurity:    Worry: Not on file    Inability: Not on file  . Transportation needs:    Medical: Not on file    Non-medical: Not on file  Tobacco Use  . Smoking status: Former Research scientist (life sciences)  . Smokeless tobacco: Never Used  Substance and Sexual Activity  . Alcohol use: No    Alcohol/week: 0.0 standard drinks  . Drug use: No  . Sexual  activity: Not on file  Lifestyle  . Physical activity:    Days per week: Not on file    Minutes per session: Not on file  . Stress: Not on file  Relationships  . Social connections:    Talks on phone: Not on file    Gets together: Not on file    Attends religious service: Not on file    Active member of club or organization: Not on file    Attends meetings of clubs or organizations: Not on file    Relationship status: Not on file  . Intimate partner violence:    Fear of current or ex partner: Not on file    Emotionally abused: Not on file    Physically abused: Not on file    Forced sexual activity: Not on file  Other Topics Concern  . Not on file  Social History Narrative   Lives with sister but stays with his friend on the weekends.  Has no children.  Retired from several different jobs.    Allergies  Allergen Reactions  . Latex Hives    Current Outpatient Medications  Medication Sig Dispense Refill  . aspirin 81 MG chewable tablet Chew by mouth daily.    . baclofen (LIORESAL) 10 MG tablet Take 10 mg by mouth 3 (three)  times daily.  3  . BREO ELLIPTA 100-25 MCG/INH AEPB TAKE 1 PUFF BY MOUTH EVERY DAY  6  . Calcium Citrate 200 MG TABS Take 630 mg by mouth.    . diphenoxylate-atropine (LOMOTIL) 2.5-0.025 MG tablet TAKE 1 TABLET BY MOUTH EVERY DAY AS NEEDED FOR LOOS BM  1  . fexofenadine (ALLEGRA) 180 MG tablet Take 180 mg by mouth.    Marland Kitchen FLUoxetine (PROZAC) 20 MG capsule Take 20 mg by mouth.    . hydrochlorothiazide (HYDRODIURIL) 25 MG tablet Take 25 mg by mouth.    Marland Kitchen lisinopril (PRINIVIL,ZESTRIL) 40 MG tablet Take 40 mg by mouth.    . Magnesium 250 MG TABS Take by mouth.    . Omega-3 Fatty Acids (FISH OIL) 1000 MG CAPS Take 1 capsule by mouth.    . Red Yeast Rice 600 MG CAPS Take by mouth.    . traZODone (DESYREL) 50 MG tablet Take 50 mg by mouth.    . vitamin C (ASCORBIC ACID) 500 MG tablet Take 500 mg by mouth.     No current facility-administered medications for this  visit.     REVIEW OF SYSTEMS:  [X]  denotes positive finding, [ ]  denotes negative finding Cardiac  Comments:  Chest pain or chest pressure:    Shortness of breath upon exertion:    Short of breath when lying flat:    Irregular heart rhythm:        Vascular    Pain in calf, thigh, or hip brought on by ambulation:    Pain in feet at night that wakes you up from your sleep:     Blood clot in your veins:    Leg swelling:         Pulmonary    Oxygen at home:    Productive cough:     Wheezing:         Neurologic    Sudden weakness in arms or legs:  x   Sudden numbness in arms or legs:  x   Sudden onset of difficulty speaking or slurred speech: x   Temporary loss of vision in one eye:     Problems with dizziness:         Gastrointestinal    Blood in stool:     Vomited blood:         Genitourinary    Burning when urinating:     Blood in urine:        Psychiatric    Major depression:         Hematologic    Bleeding problems:    Problems with blood clotting too easily:        Skin    Rashes or ulcers:        Constitutional    Fever or chills:      PHYSICAL EXAM: Vitals:   08/31/18 1056  BP: (!) 187/89  Pulse: 65  Resp: 20  Temp: 98.2 F (36.8 C)  TempSrc: Temporal  Weight: 144 lb 6.4 oz (65.5 kg)  Height: 5\' 11"  (1.803 m)    GENERAL: The patient is a well-nourished male, in no acute distress. The vital signs are documented above. CARDIOVASCULAR: Carotid arteries without bruits bilaterally.  2+ radial pulses bilaterally PULMONARY: There is good air exchange  ABDOMEN: Soft and non-tender  MUSCULOSKELETAL: There are no major deformities or cyanosis. NEUROLOGIC: No focal weakness or paresthesias are detected. SKIN: There are no ulcers or rashes noted. PSYCHIATRIC: The patient has a normal affect.  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  Have a very  irregular plaque at the left carotid bifurcation  MEDICAL ISSUES: Because these findings at length with the patient and his friend present.  Have recommended carotid endarterectomy for reduction of stroke risk.  I explained the procedure of carotid endarterectomy to include 1 to 2% risk of stroke with surgery.  Also discussed potential for cranial nerve injury.  He understands and wished to proceed.  Understands this will be admitted the day of surgery with a planned overnight hospitalization and discharge to home the following day.   Rosetta Posner, MD FACS Vascular and Vein Specialists of [facility] Tel [phone] Pager [phone]

## 2018-09-01 ENCOUNTER — Encounter: Payer: Self-pay | Admitting: Internal Medicine

## 2018-09-01 ENCOUNTER — Telehealth: Payer: Self-pay | Admitting: *Deleted

## 2018-09-01 NOTE — Telephone Encounter (Signed)
Left a message on voice mail for caregiver Guy Franco to be at [facility] admitting at 9 am on 09/04/18. All other instructions unchanged.

## 2018-09-01 NOTE — Pre-Procedure Instructions (Signed)
Rodrickus Min Brumbaugh  09/01/2018      [id number] [place],  - [place] [address] [place] [address] Phone: [phone] Fax: [phone]    Your procedure is scheduled on Nov 8th.  Report to [facility] Admitting at 9:45 A.M.  Call this number if you have problems the morning of surgery:  [phone]   Remember:  Do not eat or drink after midnight.     Take these medicines the morning of surgery with A SIP OF WATER   Breo Ellipta  Prozac   Follow your surgeon's instructions on when to stop Asprin.  If no instructions were given by your surgeon then you will need to call the office to get those instructions.    7 days prior to surgery STOP taking any Aspirin(unless otherwise instructed by your surgeon), Aleve, Naproxen, Ibuprofen, Motrin, Advil, Goody's, BC's, all herbal medications, fish oil, and all vitamins    Do not wear jewelry.  Do not wear lotions, powders, or perfumes, or deodorant.  Do not shave 48 hours prior to surgery.  Men may shave face and neck.  Do not bring valuables to the hospital.  [facility] is not responsible for any belongings or valuables.   [place]- Preparing For Surgery  Before surgery, you can play an important role. Because skin is not sterile, your skin needs to be as free of germs as possible. You can reduce the number of germs on your skin by washing with CHG (chlorahexidine gluconate) Soap before surgery.  CHG is an antiseptic cleaner which kills germs and bonds with the skin to continue killing germs even after washing.    Oral Hygiene is also important to reduce your risk of infection.  Remember - BRUSH YOUR TEETH THE MORNING OF SURGERY WITH YOUR REGULAR TOOTHPASTE  Please do not use if you have an allergy to CHG or antibacterial soaps. If your skin becomes reddened/irritated stop using the CHG.  Do not shave (including legs and underarms) for at least 48 hours prior to  first CHG shower. It is OK to shave your face.  Please follow these instructions carefully.   1. Shower the NIGHT BEFORE SURGERY and the MORNING OF SURGERY with CHG.   2. If you chose to wash your hair, wash your hair first as usual with your normal shampoo.  3. After you shampoo, rinse your hair and body thoroughly to remove the shampoo.  4. Use CHG as you would any other liquid soap. You can apply CHG directly to the skin and wash gently with a scrungie or a clean washcloth.   5. Apply the CHG Soap to your body ONLY FROM THE NECK DOWN.  Do not use on open wounds or open sores. Avoid contact with your eyes, ears, mouth and genitals (private parts). Wash Face and genitals (private parts)  with your normal soap.  6. Wash thoroughly, paying special attention to the area where your surgery will be performed.  7. Thoroughly rinse your body with warm water from the neck down.  8. DO NOT shower/wash with your normal soap after using and rinsing off the CHG Soap.  9. Pat yourself dry with a CLEAN TOWEL.  10. Wear CLEAN PAJAMAS to bed the night before surgery, wear comfortable clothes the morning of surgery  11. Place CLEAN SHEETS on your bed the night of your first shower and DO NOT SLEEP WITH PETS.  Day of Surgery:  Do not apply any deodorants/lotions.  Please wear clean clothes to the hospital/surgery center.   Remember to brush your teeth WITH YOUR REGULAR TOOTHPASTE.   Contacts, dentures or bridgework may not be worn into surgery.  Leave your suitcase in the car.  After surgery it may be brought to your room.  For patients admitted to the hospital, discharge time will be determined by your treatment team.  Patients discharged the day of surgery will not be allowed to drive home.   Please read over the following fact sheets that you were given. Coughing and Deep Breathing, MRSA Information and Surgical Site Infection Prevention

## 2018-09-02 ENCOUNTER — Encounter (HOSPITAL_COMMUNITY): Payer: Self-pay

## 2018-09-02 ENCOUNTER — Encounter (HOSPITAL_COMMUNITY)
Admission: RE | Admit: 2018-09-02 | Discharge: 2018-09-02 | Disposition: A | Discharge status: Home / Self Care | Payer: PPO | Source: Ambulatory Visit | Attending: Vascular Surgery | Admitting: Vascular Surgery

## 2018-09-02 ENCOUNTER — Other Ambulatory Visit: Payer: Self-pay

## 2018-09-02 DIAGNOSIS — Z01812 Encounter for preprocedural laboratory examination: Secondary | ICD-10-CM | POA: Insufficient documentation

## 2018-09-02 DIAGNOSIS — Z91040 Latex allergy status: Secondary | ICD-10-CM | POA: Diagnosis not present

## 2018-09-02 DIAGNOSIS — M810 Age-related osteoporosis without current pathological fracture: Secondary | ICD-10-CM | POA: Diagnosis present

## 2018-09-02 DIAGNOSIS — M1990 Unspecified osteoarthritis, unspecified site: Secondary | ICD-10-CM | POA: Diagnosis present

## 2018-09-02 DIAGNOSIS — Z87891 Personal history of nicotine dependence: Secondary | ICD-10-CM | POA: Diagnosis not present

## 2018-09-02 DIAGNOSIS — K219 Gastro-esophageal reflux disease without esophagitis: Secondary | ICD-10-CM | POA: Diagnosis not present

## 2018-09-02 DIAGNOSIS — I69320 Aphasia following cerebral infarction: Secondary | ICD-10-CM | POA: Diagnosis not present

## 2018-09-02 DIAGNOSIS — Z8619 Personal history of other infectious and parasitic diseases: Secondary | ICD-10-CM | POA: Diagnosis not present

## 2018-09-02 DIAGNOSIS — Z7982 Long term (current) use of aspirin: Secondary | ICD-10-CM | POA: Diagnosis not present

## 2018-09-02 DIAGNOSIS — I739 Peripheral vascular disease, unspecified: Secondary | ICD-10-CM | POA: Diagnosis not present

## 2018-09-02 DIAGNOSIS — I69351 Hemiplegia and hemiparesis following cerebral infarction affecting right dominant side: Secondary | ICD-10-CM | POA: Diagnosis not present

## 2018-09-02 DIAGNOSIS — I10 Essential (primary) hypertension: Secondary | ICD-10-CM | POA: Diagnosis present

## 2018-09-02 DIAGNOSIS — I6522 Occlusion and stenosis of left carotid artery: Secondary | ICD-10-CM | POA: Diagnosis present

## 2018-09-02 DIAGNOSIS — Z79899 Other long term (current) drug therapy: Secondary | ICD-10-CM | POA: Diagnosis not present

## 2018-09-02 DIAGNOSIS — J439 Emphysema, unspecified: Secondary | ICD-10-CM | POA: Diagnosis present

## 2018-09-02 HISTORY — DX: Transient cerebral ischemic attack, unspecified: G45.9

## 2018-09-02 LAB — CBC
HCT: 43.2 % (ref 39.0–52.0)
Hemoglobin: 14 g/dL (ref 13.0–17.0)
MCH: 30.4 pg (ref 26.0–34.0)
MCHC: 32.4 g/dL (ref 30.0–36.0)
MCV: 93.9 fL (ref 80.0–100.0)
PLATELETS: 238 10*3/uL (ref 150–400)
RBC: 4.6 MIL/uL (ref 4.22–5.81)
RDW: 12.6 % (ref 11.5–15.5)
WBC: 7.5 10*3/uL (ref 4.0–10.5)
nRBC: 0 % (ref 0.0–0.2)

## 2018-09-02 LAB — SURGICAL PCR SCREEN
MRSA, PCR: NEGATIVE
STAPHYLOCOCCUS AUREUS: NEGATIVE

## 2018-09-02 LAB — COMPREHENSIVE METABOLIC PANEL
ALBUMIN: 4 g/dL (ref 3.5–5.0)
ALT: 22 U/L (ref 0–44)
AST: 27 U/L (ref 15–41)
Alkaline Phosphatase: 41 U/L (ref 38–126)
Anion gap: 9 (ref 5–15)
BILIRUBIN TOTAL: 0.9 mg/dL (ref 0.3–1.2)
BUN: 13 mg/dL (ref 8–23)
CHLORIDE: 98 mmol/L (ref 98–111)
CO2: 27 mmol/L (ref 22–32)
Calcium: 9.6 mg/dL (ref 8.9–10.3)
Creatinine, Ser: 0.84 mg/dL (ref 0.61–1.24)
GFR calc Af Amer: >60 mL/min (ref >60)
GLUCOSE: 93 mg/dL (ref 70–99)
POTASSIUM: 3.7 mmol/L (ref 3.5–5.1)
Sodium: 134 mmol/L — ABNORMAL LOW (ref 135–145)
TOTAL PROTEIN: 7.4 g/dL (ref 6.5–8.1)

## 2018-09-02 LAB — URINALYSIS, ROUTINE W REFLEX MICROSCOPIC
BILIRUBIN URINE: NEGATIVE
GLUCOSE, UA: NEGATIVE mg/dL
HGB URINE DIPSTICK: NEGATIVE
Ketones, ur: NEGATIVE mg/dL
Leukocytes, UA: NEGATIVE
Nitrite: NEGATIVE
PH: 6 (ref 5.0–8.0)
Protein, ur: NEGATIVE mg/dL
SPECIFIC GRAVITY, URINE: 1.018 (ref 1.005–1.030)

## 2018-09-02 LAB — TYPE AND SCREEN
ABO/RH(D): A POS
ANTIBODY SCREEN: NEGATIVE

## 2018-09-02 LAB — APTT: aPTT: 30 seconds (ref 24–36)

## 2018-09-02 LAB — PROTIME-INR
INR: 1.01
PROTHROMBIN TIME: 13.2 s (ref 11.4–15.2)

## 2018-09-02 NOTE — Progress Notes (Signed)
PCP - Dr. Woody Seller Cardiologist - patient denies  Chest x-ray - n/a EKG - 08/11/2018, tracing requested Stress Test - patient and friend unsure ECHO -  patient and friend unsure Cardiac Cath -  patient and friend unsure  Sleep Study - patient denies  Aspirin Instructions: per instructions from Dr. Luther Parody office, patient to continue ASA  Anesthesia review: yes, EKG tracing (requested); recent hospitalization for TIA  Patient denies shortness of breath, fever, cough and chest pain at PAT appointment   Patient verbalized understanding of instructions that were given to them at the PAT appointment. Patient was also instructed that they will need to review over the PAT instructions again at home before surgery.

## 2018-09-02 NOTE — Pre-Procedure Instructions (Signed)
Miguel Daniels  09/02/2018      [id number] [place], [place] - [place] [address] [place] [address] Phone: [phone] Fax: [phone]    Your procedure is scheduled on Nov 8th.  Report to [facility] Admitting at 9:45 A.M.  Call this number if you have problems the morning of surgery:  [phone]   Remember:  Do not eat or drink after midnight.     Take these medicines the morning of surgery with A SIP OF WATER  Baclofen (Lioresal)  Breo Ellipta inhaler  Fluoxetine (Prozac)   Follow your surgeon's instructions on when to stop Asprin.  If no instructions were given by your surgeon then you will need to call the office to get those instructions.    7 days prior to surgery STOP taking any Aleve, Naproxen, Ibuprofen, Motrin, Advil, Goody's, BC's, all herbal medications, fish oil, and all vitamins    Do not wear jewelry.  Do not wear lotions, powders, or colognes, or deodorant.  Men may shave face and neck.  Do not bring valuables to the hospital.  [facility] is not responsible for any belongings or valuables.   [place]- Preparing For Surgery  Before surgery, you can play an important role. Because skin is not sterile, your skin needs to be as free of germs as possible. You can reduce the number of germs on your skin by washing with CHG (chlorahexidine gluconate) Soap before surgery.  CHG is an antiseptic cleaner which kills germs and bonds with the skin to continue killing germs even after washing.    Oral Hygiene is also important to reduce your risk of infection.  Remember - BRUSH YOUR TEETH THE MORNING OF SURGERY WITH YOUR REGULAR TOOTHPASTE  Please do not use if you have an allergy to CHG or antibacterial soaps. If your skin becomes reddened/irritated stop using the CHG.  Do not shave (including legs and underarms) for at least 48 hours prior to first CHG shower. It is OK to shave your  face.  Please follow these instructions carefully.   1. Shower the NIGHT BEFORE SURGERY and the MORNING OF SURGERY with CHG.   2. If you chose to wash your hair, wash your hair first as usual with your normal shampoo.  3. After you shampoo, rinse your hair and body thoroughly to remove the shampoo.  4. Use CHG as you would any other liquid soap. You can apply CHG directly to the skin and wash gently with a scrungie or a clean washcloth.   5. Apply the CHG Soap to your body ONLY FROM THE NECK DOWN.  Do not use on open wounds or open sores. Avoid contact with your eyes, ears, mouth and genitals (private parts). Wash Face and genitals (private parts)  with your normal soap.  6. Wash thoroughly, paying special attention to the area where your surgery will be performed.  7. Thoroughly rinse your body with warm water from the neck down.  8. DO NOT shower/wash with your normal soap after using and rinsing off the CHG Soap.  9. Pat yourself dry with a CLEAN TOWEL.  10. Wear CLEAN PAJAMAS to bed the night before surgery, wear comfortable clothes the morning of surgery  11. Place CLEAN SHEETS on your bed the night of your first shower and DO NOT SLEEP WITH PETS.    Day of Surgery: Shower as stated above. Do not  apply any deodorants/lotions.  Please wear clean clothes to the hospital/surgery center.   Remember to brush your teeth WITH YOUR REGULAR TOOTHPASTE.   Contacts, eyeglasses, hearing aids, dentures or bridgework may not be worn into surgery.  Leave your suitcase in the car.  After surgery it may be brought to your room.  For patients admitted to the hospital, discharge time will be determined by your treatment team.  Patients discharged the day of surgery will not be allowed to drive home.   Please read over the following fact sheets that you were given. Coughing and Deep Breathing, MRSA Information and Surgical Site Infection Prevention

## 2018-09-03 LAB — ABO/RH: ABO/RH(D): A POS

## 2018-09-03 NOTE — Progress Notes (Addendum)
Anesthesia Chart Review:  Case:  [id number] Date/Time:  09/04/18 1129   Procedure:  ENDARTERECTOMY CAROTID LEFT (Left )   Anesthesia type:  General   Pre-op diagnosis:  LEFT CAROTID ARTERY STENOSIS   Location:  [facility] OR ROOM 11 / [facility] OR   Surgeon:  Rosetta Posner, MD      DISCUSSION: 77 yo male former smoker. 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  Pt was admitted to [facility] 08/11/2018 for TIA. MRI brain was normal. CT Angio of the neck showed critical stenosis at his left bifurcation. He returned to baseline and was discharged. He was seen by his PCP and referred to [facility] for management.  Records from [facility] admission reviewed (records on pt chart). EKG shows diffuse ST depression consistent with ischemia. No previous tracing for comparison in Epic or from PCP. It does not appear echo was performed during admission. Review of notes in care everywhere shows pt was seen by cardiology in 2015 and noted at that time the pt had a normal echo in 2014. Discussed getting future stress echo but does not appear this was done.  Due to abn EKG, case discussed with Dr. Glennon Mac and Dr. Donnetta Hutching. They agreed that EKG should be repeated on DOS and pt should be evaluated for any anginal symptoms. If no anginal symptoms, proceed with surgery as planned and recommend cardiology followup for further eval.   VS: BP (!) 150/89   Pulse 64   Temp 37.1 C   Resp 18   Ht 5\' 11"  (1.803 m)   Wt 63.9 kg   SpO2 95%   BMI 19.65 kg/m   PROVIDERS: Glenda Chroman, MD is PCP   LABS: Labs reviewed: Acceptable for surgery. (all labs ordered are listed, but only abnormal results are displayed)  Labs Reviewed  COMPREHENSIVE METABOLIC PANEL - Abnormal; Notable for the following components:      Result Value   Sodium 134 (*)    All other components within normal limits  SURGICAL PCR SCREEN  APTT   CBC  PROTIME-INR  URINALYSIS, ROUTINE W REFLEX MICROSCOPIC  TYPE AND SCREEN  ABO/RH     IMAGES: CTA Neck 08/11/2018 [facility]): Impression: 1.  Negative CTA for emergent large vessel occlusion. 2.  Severe near occlusive greater than 90% stenosis of the origin of the left ICA. 3.  Atheromatous stenosis of approximately 50% at the origin of the right internal carotid artery. 4.  Approximate 50% atheromatous stenosis at the origin of the vertebral arteries bilaterally.  The vertebrobasilar system otherwise patent without significant stenosis. 5.  Emphysema  EKG: 08/11/2018: Sinus rhythm.  Prolonged PR interval.  PR 259.  Probable left atrial enlargement.  RSR' in V1 or V2, right VCD or RVH.  Consider anterolateral infarct.  Repolarization abnormality, severe global ischemia.  CV: N/A  Past Medical History:  Diagnosis Date  . Acid reflux   . Arthritis   . Hepatitis   . High blood pressure   . Migraines   . Osteoporosis   . Stroke (cerebrum) ([place])   . TIA (transient ischemic attack) 08/12/2015    Past Surgical History:  Procedure Laterality Date  . TONSILLECTOMY      MEDICATIONS: . aspirin 81 MG chewable tablet  . baclofen (LIORESAL) 10 MG tablet  . BREO ELLIPTA 100-25 MCG/INH AEPB  . Calcium Carb-Cholecalciferol (CALCIUM-VITAMIN D3) 600-500 MG-UNIT CAPS  . FLUoxetine (PROZAC) 20  MG capsule  . folic acid (FOLVITE) 883 MCG tablet  . hydrochlorothiazide (HYDRODIURIL) 25 MG tablet  . lisinopril (PRINIVIL,ZESTRIL) 40 MG tablet  . Multiple Vitamins-Minerals (MULTIVITAMIN PO)  . Omega-3 Fatty Acids (FISH OIL) 1000 MG CAPS  . Probiotic Product (PROBIOTIC PO)  . Red Yeast Rice 600 MG CAPS  . traZODone (DESYREL) 50 MG tablet  . vitamin C (ASCORBIC ACID) 500 MG tablet   No current facility-administered medications for this encounter.     Wynonia Musty [facility]/Anesthesiology Phone [phone] 09/03/2018 12:26 PM

## 2018-09-03 NOTE — Anesthesia Preprocedure Evaluation (Addendum)
Anesthesia Evaluation  Patient identified by MRN, date of birth, ID band Patient awake    Reviewed: Allergy & Precautions, NPO status , Patient's Chart, lab work & pertinent test results  History of Anesthesia Complications Negative for: history of anesthetic complications  Airway Mallampati: I  TM Distance: >3 FB Neck ROM: Full    Dental  (+) Chipped, Dental Advisory Given   Pulmonary former smoker,    breath sounds clear to auscultation       Cardiovascular hypertension, Pt. on medications (-) angina+ Peripheral Vascular Disease   Rhythm:Regular Rate:Normal  '14 ECHO: normal   Neuro/Psych 08/11/18 TIA '04 intracranial hemorrhage: residual expressive aphasia and right-sided weakness TIACVA (R side weakness), Residual Symptoms    GI/Hepatic GERD  Controlled,(+) Hepatitis -, B  Endo/Other    Renal/GU negative Renal ROS     Musculoskeletal  (+) Arthritis ,   Abdominal   Peds  Hematology negative hematology ROS (+)   Anesthesia Other Findings   Reproductive/Obstetrics                           Anesthesia Physical Anesthesia Plan  ASA: III  Anesthesia Plan: General   Post-op Pain Management:    Induction: Intravenous  PONV Risk Score and Plan: 2 and Ondansetron, Dexamethasone and Treatment may vary due to age or medical condition  Airway Management Planned: Oral ETT  Additional Equipment: Arterial line  Intra-op Plan:   Post-operative Plan: Extubation in OR  Informed Consent: I have reviewed the patients History and Physical, chart, labs and discussed the procedure including the risks, benefits and alternatives for the proposed anesthesia with the patient or authorized representative who has indicated his/her understanding and acceptance.   Dental advisory given  Plan Discussed with:   Anesthesia Plan Comments: (Abnormal EKG discussed with Dr. Glennon Mac and Dr. Donnetta Hutching. See PAT  note 09/02/2018 by Karoline Caldwell, PA-C Repeat EKG today without ST-T abnormality (12 lead at time of TIA showed lateral ST depression), pt has never had chest heaviness, tightness, pain, no increased shortness of breath or diaphoresis with activity. Agree to proceed to improve cerebral blood flow and have cardiologic evaluation post op)       Anesthesia Quick Evaluation

## 2018-09-04 ENCOUNTER — Encounter (HOSPITAL_COMMUNITY): Payer: Self-pay

## 2018-09-04 ENCOUNTER — Encounter (HOSPITAL_COMMUNITY)
Admission: RE | Disposition: A | Discharge status: Home / Self Care | Payer: Self-pay | Source: Ambulatory Visit | Attending: Vascular Surgery

## 2018-09-04 ENCOUNTER — Other Ambulatory Visit: Payer: Self-pay

## 2018-09-04 ENCOUNTER — Inpatient Hospital Stay (HOSPITAL_COMMUNITY): Payer: PPO | Admitting: Physician Assistant

## 2018-09-04 ENCOUNTER — Inpatient Hospital Stay (HOSPITAL_COMMUNITY)
Admission: RE | Admit: 2018-09-04 | Discharge: 2018-09-05 | DRG: 038 | Disposition: A | Discharge status: Home / Self Care | Payer: PPO | Source: Ambulatory Visit | Attending: Vascular Surgery | Admitting: Vascular Surgery

## 2018-09-04 ENCOUNTER — Inpatient Hospital Stay (HOSPITAL_COMMUNITY): Payer: PPO | Admitting: Anesthesiology

## 2018-09-04 DIAGNOSIS — I69351 Hemiplegia and hemiparesis following cerebral infarction affecting right dominant side: Secondary | ICD-10-CM | POA: Diagnosis not present

## 2018-09-04 DIAGNOSIS — I6522 Occlusion and stenosis of left carotid artery: Principal | ICD-10-CM | POA: Diagnosis present

## 2018-09-04 DIAGNOSIS — J439 Emphysema, unspecified: Secondary | ICD-10-CM | POA: Diagnosis present

## 2018-09-04 DIAGNOSIS — I69320 Aphasia following cerebral infarction: Secondary | ICD-10-CM | POA: Diagnosis not present

## 2018-09-04 DIAGNOSIS — Z87891 Personal history of nicotine dependence: Secondary | ICD-10-CM

## 2018-09-04 DIAGNOSIS — Z7982 Long term (current) use of aspirin: Secondary | ICD-10-CM | POA: Diagnosis not present

## 2018-09-04 DIAGNOSIS — I6529 Occlusion and stenosis of unspecified carotid artery: Secondary | ICD-10-CM | POA: Diagnosis present

## 2018-09-04 DIAGNOSIS — Z91040 Latex allergy status: Secondary | ICD-10-CM

## 2018-09-04 DIAGNOSIS — I10 Essential (primary) hypertension: Secondary | ICD-10-CM | POA: Diagnosis present

## 2018-09-04 DIAGNOSIS — M810 Age-related osteoporosis without current pathological fracture: Secondary | ICD-10-CM | POA: Diagnosis present

## 2018-09-04 DIAGNOSIS — Z8619 Personal history of other infectious and parasitic diseases: Secondary | ICD-10-CM | POA: Diagnosis not present

## 2018-09-04 DIAGNOSIS — M1990 Unspecified osteoarthritis, unspecified site: Secondary | ICD-10-CM | POA: Diagnosis present

## 2018-09-04 DIAGNOSIS — Z79899 Other long term (current) drug therapy: Secondary | ICD-10-CM

## 2018-09-04 HISTORY — DX: Occlusion and stenosis of unspecified carotid artery: I65.29

## 2018-09-04 HISTORY — PX: ENDARTERECTOMY: SHX5162

## 2018-09-04 HISTORY — PX: PATCH ANGIOPLASTY: SHX6230

## 2018-09-04 LAB — CREATININE, SERUM
CREATININE: 0.93 mg/dL (ref 0.61–1.24)
GFR calc Af Amer: >60 mL/min (ref >60)
GFR calc non Af Amer: >60 mL/min (ref >60)

## 2018-09-04 LAB — CBC
HCT: 35.3 % — ABNORMAL LOW (ref 39.0–52.0)
HEMOGLOBIN: 11.5 g/dL — AB (ref 13.0–17.0)
MCH: 30.1 pg (ref 26.0–34.0)
MCHC: 32.6 g/dL (ref 30.0–36.0)
MCV: 92.4 fL (ref 80.0–100.0)
Platelets: 181 10*3/uL (ref 150–400)
RBC: 3.82 MIL/uL — ABNORMAL LOW (ref 4.22–5.81)
RDW: 12.6 % (ref 11.5–15.5)
WBC: 9 10*3/uL (ref 4.0–10.5)
nRBC: 0 % (ref 0.0–0.2)

## 2018-09-04 LAB — POCT ACTIVATED CLOTTING TIME: ACTIVATED CLOTTING TIME: 268 s

## 2018-09-04 SURGERY — ENDARTERECTOMY, CAROTID
Anesthesia: General | Site: Neck | Laterality: Left

## 2018-09-04 MED ORDER — PROPOFOL 10 MG/ML IV BOLUS
INTRAVENOUS | Status: DC | PRN
  Administered 2018-09-04: 100 mg via INTRAVENOUS
  Administered 2018-09-04: 30 mg via INTRAVENOUS
  Administered 2018-09-04: 50 mg via INTRAVENOUS

## 2018-09-04 MED ORDER — LIDOCAINE 2% (20 MG/ML) 5 ML SYRINGE
INTRAMUSCULAR | Status: AC
  Filled 2018-09-04: qty 5

## 2018-09-04 MED ORDER — CHLORHEXIDINE GLUCONATE 4 % EX LIQD: 60.0000 mL | Freq: Once | CUTANEOUS | Status: DC

## 2018-09-04 MED ORDER — BISACODYL 5 MG PO TBEC: 5.0000 mg | DELAYED_RELEASE_TABLET | Freq: Every day | ORAL | Status: DC | PRN

## 2018-09-04 MED ORDER — ASPIRIN 81 MG PO CHEW
81.0000 mg | CHEWABLE_TABLET | Freq: Every day | ORAL | Status: DC
  Administered 2018-09-04 – 2018-09-05 (×2): 81 mg via ORAL
  Filled 2018-09-04 (×2): qty 1

## 2018-09-04 MED ORDER — CEFAZOLIN SODIUM-DEXTROSE 2-4 GM/100ML-% IV SOLN
2.0000 g | INTRAVENOUS | Status: AC
  Administered 2018-09-04: 2 g via INTRAVENOUS

## 2018-09-04 MED ORDER — CEFAZOLIN SODIUM-DEXTROSE 2-4 GM/100ML-% IV SOLN
INTRAVENOUS | Status: AC
  Filled 2018-09-04: qty 100

## 2018-09-04 MED ORDER — PANTOPRAZOLE SODIUM 40 MG PO TBEC
40.0000 mg | DELAYED_RELEASE_TABLET | Freq: Every day | ORAL | Status: DC
  Administered 2018-09-05: 40 mg via ORAL
  Filled 2018-09-04: qty 1

## 2018-09-04 MED ORDER — LISINOPRIL 10 MG PO TABS
10.0000 mg | ORAL_TABLET | Freq: Every day | ORAL | Status: DC
  Filled 2018-09-04: qty 1

## 2018-09-04 MED ORDER — ROCURONIUM BROMIDE 10 MG/ML (PF) SYRINGE
PREFILLED_SYRINGE | INTRAVENOUS | Status: DC | PRN
  Administered 2018-09-04: 10 mg via INTRAVENOUS
  Administered 2018-09-04: 50 mg via INTRAVENOUS
  Administered 2018-09-04: 10 mg via INTRAVENOUS

## 2018-09-04 MED ORDER — FLUTICASONE FUROATE-VILANTEROL 100-25 MCG/INH IN AEPB
1.0000 | INHALATION_SPRAY | Freq: Every day | RESPIRATORY_TRACT | Status: DC
  Filled 2018-09-04: qty 28

## 2018-09-04 MED ORDER — BACLOFEN 10 MG PO TABS
10.0000 mg | ORAL_TABLET | Freq: Three times a day (TID) | ORAL | Status: DC
  Administered 2018-09-04 – 2018-09-05 (×2): 10 mg via ORAL
  Filled 2018-09-04 (×4): qty 1

## 2018-09-04 MED ORDER — DEXAMETHASONE SODIUM PHOSPHATE 10 MG/ML IJ SOLN
INTRAMUSCULAR | Status: DC | PRN
  Administered 2018-09-04: 10 mg via INTRAVENOUS

## 2018-09-04 MED ORDER — FLUOXETINE HCL 20 MG PO CAPS
20.0000 mg | ORAL_CAPSULE | Freq: Every day | ORAL | Status: DC
  Administered 2018-09-05: 20 mg via ORAL
  Filled 2018-09-04: qty 1

## 2018-09-04 MED ORDER — FENTANYL CITRATE (PF) 250 MCG/5ML IJ SOLN
INTRAMUSCULAR | Status: DC | PRN
  Administered 2018-09-04 (×3): 50 ug via INTRAVENOUS

## 2018-09-04 MED ORDER — SODIUM CHLORIDE 0.9 % IV SOLN: INTRAVENOUS | Status: DC

## 2018-09-04 MED ORDER — CEFAZOLIN SODIUM-DEXTROSE 2-4 GM/100ML-% IV SOLN
2.0000 g | Freq: Three times a day (TID) | INTRAVENOUS | Status: AC
  Administered 2018-09-04 – 2018-09-05 (×2): 2 g via INTRAVENOUS
  Filled 2018-09-04 (×2): qty 100

## 2018-09-04 MED ORDER — LIDOCAINE HCL (PF) 1 % IJ SOLN
INTRAMUSCULAR | Status: AC
  Filled 2018-09-04: qty 5

## 2018-09-04 MED ORDER — PHENYLEPHRINE 40 MCG/ML (10ML) SYRINGE FOR IV PUSH (FOR BLOOD PRESSURE SUPPORT)
PREFILLED_SYRINGE | INTRAVENOUS | Status: DC | PRN
  Administered 2018-09-04 (×3): 80 ug via INTRAVENOUS

## 2018-09-04 MED ORDER — VITAMIN C 500 MG PO TABS
500.0000 mg | ORAL_TABLET | Freq: Every day | ORAL | Status: DC
  Administered 2018-09-05: 500 mg via ORAL
  Filled 2018-09-04: qty 1

## 2018-09-04 MED ORDER — ENOXAPARIN SODIUM 30 MG/0.3ML ~~LOC~~ SOLN
30.0000 mg | SUBCUTANEOUS | Status: DC
  Filled 2018-09-04: qty 0.3

## 2018-09-04 MED ORDER — HEPARIN SODIUM (PORCINE) 1000 UNIT/ML IJ SOLN
INTRAMUSCULAR | Status: AC
  Filled 2018-09-04: qty 1

## 2018-09-04 MED ORDER — OXYCODONE HCL 5 MG PO TABS: 5.0000 mg | ORAL_TABLET | ORAL | Status: DC | PRN

## 2018-09-04 MED ORDER — SODIUM CHLORIDE 0.9 % IV SOLN
500.0000 mL | Freq: Once | INTRAVENOUS | Status: AC | PRN
  Administered 2018-09-04: 500 mL via INTRAVENOUS

## 2018-09-04 MED ORDER — SENNOSIDES-DOCUSATE SODIUM 8.6-50 MG PO TABS: 1.0000 | ORAL_TABLET | Freq: Every evening | ORAL | Status: DC | PRN

## 2018-09-04 MED ORDER — HYDRALAZINE HCL 20 MG/ML IJ SOLN
INTRAMUSCULAR | Status: AC
  Filled 2018-09-04: qty 1

## 2018-09-04 MED ORDER — ACETAMINOPHEN 325 MG PO TABS: 325.0000 mg | ORAL_TABLET | ORAL | Status: DC | PRN

## 2018-09-04 MED ORDER — ALUM & MAG HYDROXIDE-SIMETH 200-200-20 MG/5ML PO SUSP: 15.0000 mL | ORAL | Status: DC | PRN

## 2018-09-04 MED ORDER — SODIUM CHLORIDE 0.9 % IV SOLN
INTRAVENOUS | Status: DC
  Administered 2018-09-04: 16:00:00 via INTRAVENOUS

## 2018-09-04 MED ORDER — LABETALOL HCL 5 MG/ML IV SOLN: 10.0000 mg | INTRAVENOUS | Status: DC | PRN

## 2018-09-04 MED ORDER — METOPROLOL TARTRATE 5 MG/5ML IV SOLN: 2.0000 mg | INTRAVENOUS | Status: DC | PRN

## 2018-09-04 MED ORDER — OXYCODONE-ACETAMINOPHEN 5-325 MG PO TABS: 1.0000 | ORAL_TABLET | Freq: Four times a day (QID) | ORAL | 0 refills | Status: DC | PRN

## 2018-09-04 MED ORDER — ROCURONIUM BROMIDE 50 MG/5ML IV SOSY
PREFILLED_SYRINGE | INTRAVENOUS | Status: AC
  Filled 2018-09-04: qty 5

## 2018-09-04 MED ORDER — FENTANYL CITRATE (PF) 100 MCG/2ML IJ SOLN: 25.0000 ug | INTRAMUSCULAR | Status: DC | PRN

## 2018-09-04 MED ORDER — PROTAMINE SULFATE 10 MG/ML IV SOLN
INTRAVENOUS | Status: AC
  Filled 2018-09-04: qty 5

## 2018-09-04 MED ORDER — LIDOCAINE 2% (20 MG/ML) 5 ML SYRINGE
INTRAMUSCULAR | Status: DC | PRN
  Administered 2018-09-04: 60 mg via INTRAVENOUS

## 2018-09-04 MED ORDER — HYDRALAZINE HCL 20 MG/ML IJ SOLN: 5.0000 mg | INTRAMUSCULAR | Status: DC | PRN

## 2018-09-04 MED ORDER — MAGNESIUM SULFATE 2 GM/50ML IV SOLN: 2.0000 g | Freq: Every day | INTRAVENOUS | Status: DC | PRN

## 2018-09-04 MED ORDER — ESMOLOL HCL 100 MG/10ML IV SOLN
INTRAVENOUS | Status: AC
  Filled 2018-09-04: qty 10

## 2018-09-04 MED ORDER — HYDROCHLOROTHIAZIDE 25 MG PO TABS
25.0000 mg | ORAL_TABLET | Freq: Every day | ORAL | Status: DC
  Filled 2018-09-04: qty 1

## 2018-09-04 MED ORDER — ONDANSETRON HCL 4 MG/2ML IJ SOLN: 4.0000 mg | Freq: Four times a day (QID) | INTRAMUSCULAR | Status: DC | PRN

## 2018-09-04 MED ORDER — MORPHINE SULFATE (PF) 2 MG/ML IV SOLN: 0.5000 mg | INTRAVENOUS | Status: DC | PRN

## 2018-09-04 MED ORDER — SUGAMMADEX SODIUM 200 MG/2ML IV SOLN
INTRAVENOUS | Status: DC | PRN
  Administered 2018-09-04: 200 mg via INTRAVENOUS
  Administered 2018-09-04: 60 mg via INTRAVENOUS

## 2018-09-04 MED ORDER — SODIUM CHLORIDE 0.9 % IV SOLN
INTRAVENOUS | Status: DC | PRN
  Administered 2018-09-04: 30 ug/min via INTRAVENOUS

## 2018-09-04 MED ORDER — SODIUM CHLORIDE (PF) 0.9 % IJ SOLN
INTRAMUSCULAR | Status: AC
  Filled 2018-09-04: qty 10

## 2018-09-04 MED ORDER — POTASSIUM CHLORIDE CRYS ER 20 MEQ PO TBCR: 20.0000 meq | EXTENDED_RELEASE_TABLET | Freq: Every day | ORAL | Status: DC | PRN

## 2018-09-04 MED ORDER — PROTAMINE SULFATE 10 MG/ML IV SOLN
INTRAVENOUS | Status: DC | PRN
  Administered 2018-09-04: 50 mg via INTRAVENOUS

## 2018-09-04 MED ORDER — FENTANYL CITRATE (PF) 250 MCG/5ML IJ SOLN
INTRAMUSCULAR | Status: AC
  Filled 2018-09-04: qty 5

## 2018-09-04 MED ORDER — HEPARIN SODIUM (PORCINE) 1000 UNIT/ML IJ SOLN
INTRAMUSCULAR | Status: DC | PRN
  Administered 2018-09-04: 7000 [IU] via INTRAVENOUS

## 2018-09-04 MED ORDER — LACTATED RINGERS IV SOLN
INTRAVENOUS | Status: DC | PRN
  Administered 2018-09-04 (×2): via INTRAVENOUS

## 2018-09-04 MED ORDER — ACETAMINOPHEN 325 MG RE SUPP: 325.0000 mg | RECTAL | Status: DC | PRN

## 2018-09-04 MED ORDER — SODIUM CHLORIDE 0.9 % IV BOLUS
500.0000 mL | Freq: Once | INTRAVENOUS | Status: AC
  Administered 2018-09-04: 500 mL via INTRAVENOUS

## 2018-09-04 MED ORDER — PHENOL 1.4 % MT LIQD: 1.0000 | OROMUCOSAL | Status: DC | PRN

## 2018-09-04 MED ORDER — RED YEAST RICE 600 MG PO CAPS: 600.0000 mg | ORAL_CAPSULE | Freq: Two times a day (BID) | ORAL | Status: DC

## 2018-09-04 MED ORDER — SODIUM CHLORIDE 0.9 % IV SOLN
0.0500 ug/kg/min | INTRAVENOUS | Status: DC
  Filled 2018-09-04: qty 5000

## 2018-09-04 MED ORDER — ALBUMIN HUMAN 5 % IV SOLN
12.5000 g | Freq: Once | INTRAVENOUS | Status: AC
  Administered 2018-09-04: 12.5 g via INTRAVENOUS

## 2018-09-04 MED ORDER — LACTATED RINGERS IV SOLN
INTRAVENOUS | Status: DC | PRN
  Administered 2018-09-04: 13:00:00 via INTRAVENOUS

## 2018-09-04 MED ORDER — TRAZODONE HCL 50 MG PO TABS
50.0000 mg | ORAL_TABLET | Freq: Every day | ORAL | Status: DC
  Administered 2018-09-04: 50 mg via ORAL
  Filled 2018-09-04: qty 1

## 2018-09-04 MED ORDER — GUAIFENESIN-DM 100-10 MG/5ML PO SYRP: 15.0000 mL | ORAL_SOLUTION | ORAL | Status: DC | PRN

## 2018-09-04 MED ORDER — ALBUMIN HUMAN 5 % IV SOLN
INTRAVENOUS | Status: AC
  Filled 2018-09-04: qty 250

## 2018-09-04 MED ORDER — SODIUM CHLORIDE 0.9 % IV SOLN
INTRAVENOUS | Status: DC | PRN
  Administered 2018-09-04: 13:00:00

## 2018-09-04 MED ORDER — EPHEDRINE SULFATE-NACL 50-0.9 MG/10ML-% IV SOSY
PREFILLED_SYRINGE | INTRAVENOUS | Status: DC | PRN
  Administered 2018-09-04: 5 mg via INTRAVENOUS
  Administered 2018-09-04: 10 mg via INTRAVENOUS
  Administered 2018-09-04 (×2): 5 mg via INTRAVENOUS
  Administered 2018-09-04: 10 mg via INTRAVENOUS

## 2018-09-04 MED ORDER — ONDANSETRON HCL 4 MG/2ML IJ SOLN
INTRAMUSCULAR | Status: DC | PRN
  Administered 2018-09-04: 4 mg via INTRAVENOUS

## 2018-09-04 MED ORDER — SODIUM CHLORIDE 0.9 % IV SOLN
INTRAVENOUS | Status: DC | PRN
  Administered 2018-09-04: .2 ug/kg/min via INTRAVENOUS

## 2018-09-04 MED ORDER — 0.9 % SODIUM CHLORIDE (POUR BTL) OPTIME
TOPICAL | Status: DC | PRN
  Administered 2018-09-04: 2000 mL

## 2018-09-04 MED ORDER — SODIUM CHLORIDE 0.9 % IV SOLN
INTRAVENOUS | Status: AC
  Filled 2018-09-04: qty 1.2

## 2018-09-04 MED ORDER — DOCUSATE SODIUM 100 MG PO CAPS
100.0000 mg | ORAL_CAPSULE | Freq: Every day | ORAL | Status: DC
  Administered 2018-09-05: 100 mg via ORAL
  Filled 2018-09-04: qty 1

## 2018-09-04 MED ORDER — PROPOFOL 10 MG/ML IV BOLUS
INTRAVENOUS | Status: AC
  Filled 2018-09-04: qty 20

## 2018-09-04 SURGICAL SUPPLY — 43 items
CANISTER SUCT 3000ML PPV (MISCELLANEOUS) ×4 IMPLANT
CANNULA VESSEL 3MM 2 BLNT TIP (CANNULA) ×8 IMPLANT
CLIP LIGATING EXTRA MED SLVR (CLIP) ×4 IMPLANT
CLIP LIGATING EXTRA SM BLUE (MISCELLANEOUS) ×4 IMPLANT
COVER SURGICAL LIGHT HANDLE (MISCELLANEOUS) ×4 IMPLANT
COVER WAND RF STERILE (DRAPES) ×4 IMPLANT
CRADLE DONUT ADULT HEAD (MISCELLANEOUS) ×4 IMPLANT
DECANTER SPIKE VIAL GLASS SM (MISCELLANEOUS) IMPLANT
DERMABOND ADVANCED (GAUZE/BANDAGES/DRESSINGS) ×2
DERMABOND ADVANCED .7 DNX12 (GAUZE/BANDAGES/DRESSINGS) ×2 IMPLANT
DRAIN HEMOVAC 1/8 X 5 (WOUND CARE) IMPLANT
ELECT REM PT RETURN 9FT ADLT (ELECTROSURGICAL) ×4
ELECTRODE REM PT RTRN 9FT ADLT (ELECTROSURGICAL) ×2 IMPLANT
EVACUATOR SILICONE 100CC (DRAIN) IMPLANT
GLOVE BIOGEL PI IND STRL 7.0 (GLOVE) ×2 IMPLANT
GLOVE BIOGEL PI IND STRL 7.5 (GLOVE) ×2 IMPLANT
GLOVE BIOGEL PI INDICATOR 7.0 (GLOVE) ×2
GLOVE BIOGEL PI INDICATOR 7.5 (GLOVE) ×2
GLOVE SURG SS PI 6.5 STRL IVOR (GLOVE) ×4 IMPLANT
GLOVE SURG SS PI 7.0 STRL IVOR (GLOVE) ×4 IMPLANT
GLOVE SURG SS PI 7.5 STRL IVOR (GLOVE) ×4 IMPLANT
GOWN STRL REUS W/ TWL LRG LVL3 (GOWN DISPOSABLE) ×6 IMPLANT
GOWN STRL REUS W/TWL LRG LVL3 (GOWN DISPOSABLE) ×6
KIT BASIN OR (CUSTOM PROCEDURE TRAY) ×4 IMPLANT
KIT SHUNT ARGYLE CAROTID ART 6 (VASCULAR PRODUCTS) IMPLANT
KIT SUCTION CATH 14FR (SUCTIONS) ×4 IMPLANT
KIT TURNOVER KIT B (KITS) ×4 IMPLANT
NEEDLE 22X1 1/2 (OR ONLY) (NEEDLE) IMPLANT
NS IRRIG 1000ML POUR BTL (IV SOLUTION) ×8 IMPLANT
PACK CAROTID (CUSTOM PROCEDURE TRAY) ×4 IMPLANT
PAD ARMBOARD 7.5X6 YLW CONV (MISCELLANEOUS) ×8 IMPLANT
PATCH HEMASHIELD 8X75 (Vascular Products) ×4 IMPLANT
SHUNT CAROTID BYPASS 10 (VASCULAR PRODUCTS) ×4 IMPLANT
SUT ETHILON 3 0 PS 1 (SUTURE) IMPLANT
SUT PROLENE 6 0 CC (SUTURE) ×4 IMPLANT
SUT SILK 3 0 (SUTURE)
SUT SILK 3-0 18XBRD TIE 12 (SUTURE) IMPLANT
SUT VIC AB 3-0 SH 27 (SUTURE) ×4
SUT VIC AB 3-0 SH 27X BRD (SUTURE) ×4 IMPLANT
SUT VICRYL 4-0 PS2 18IN ABS (SUTURE) ×4 IMPLANT
SYR CONTROL 10ML LL (SYRINGE) IMPLANT
TOWEL GREEN STERILE (TOWEL DISPOSABLE) ×4 IMPLANT
WATER STERILE IRR 1000ML POUR (IV SOLUTION) ×4 IMPLANT

## 2018-09-04 NOTE — Interval H&P Note (Signed)
History and Physical Interval Note:  09/04/2018 10:37 AM  Miguel Daniels  has presented today for surgery, with the diagnosis of LEFT CAROTID ARTERY STENOSIS  The various methods of treatment have been discussed with the patient and family. After consideration of risks, benefits and other options for treatment, the patient has consented to  Procedure(s): ENDARTERECTOMY CAROTID LEFT (Left) as a surgical intervention .  The patient's history has been reviewed, patient examined, no change in status, stable for surgery.  I have reviewed the patient's chart and labs.  Questions were answered to the patient's satisfaction.     Curt Jews

## 2018-09-04 NOTE — Anesthesia Procedure Notes (Signed)
Procedure Name: Intubation Date/Time: 09/04/2018 1:07 PM Performed by: Jearld Pies, CRNA Pre-anesthesia Checklist: Patient identified, Emergency Drugs available, Suction available and Patient being monitored Patient Re-evaluated:Patient Re-evaluated prior to induction Oxygen Delivery Method: Circle System Utilized Preoxygenation: Pre-oxygenation with 100% oxygen Induction Type: IV induction Ventilation: Mask ventilation without difficulty and Oral airway inserted - appropriate to patient size Laryngoscope Size: Mac and 4 Grade View: Grade I Tube type: Oral Tube size: 7.5 mm Number of attempts: 1 Airway Equipment and Method: Stylet and Oral airway Placement Confirmation: ETT inserted through vocal cords under direct vision,  positive ETCO2 and breath sounds checked- equal and bilateral Secured at: 23 cm Tube secured with: Tape Dental Injury: Teeth and Oropharynx as per pre-operative assessment

## 2018-09-04 NOTE — Anesthesia Postprocedure Evaluation (Signed)
Anesthesia Post Note  Patient: Miguel Daniels  Procedure(s) Performed: ENDARTERECTOMY CAROTID LEFT (Left Neck) PATCH ANGIOPLASTY LEFT CAROTID ATERY USING HEMASHIELD PLATINUM FINESSE PATCH (Neck)     Patient location during evaluation: PACU Anesthesia Type: General Level of consciousness: awake and alert, oriented and patient cooperative Pain management: pain level controlled Vital Signs Assessment: post-procedure vital signs reviewed and stable Respiratory status: spontaneous breathing, nonlabored ventilation and respiratory function stable Cardiovascular status: blood pressure returned to baseline and stable Postop Assessment: no apparent nausea or vomiting Anesthetic complications: no    Last Vitals:  Vitals:   09/04/18 1620 09/04/18 1622  BP:  (!) 98/57  Pulse: 64 60  Resp:  20  Temp:    SpO2:  99%    Last Pain:  Vitals:   09/04/18 1610  TempSrc:   PainSc: 0-No pain                 Daemyn Gariepy,E. Deion Forgue

## 2018-09-04 NOTE — Discharge Instructions (Signed)
   [facility] [place]  Discharge Instructions   Carotid Endarterectomy (CEA)  Please refer to the following instructions for your post-procedure care. Your surgeon or physician assistant will discuss any changes with you.  Activity  You are encouraged to walk as much as you can. You can slowly return to normal activities but must avoid strenuous activity and heavy lifting until your doctor tell you it's OK. Avoid activities such as vacuuming or swinging a golf club. You can drive after one week if you are comfortable and you are no longer taking prescription pain medications. It is normal to feel tired for serval weeks after your surgery. It is also normal to have difficulty with sleep habits, eating, and bowel movements after surgery. These will go away with time.  Bathing/Showering  You may shower after you come home. Do not soak in a bathtub, hot tub, or swim until the incision heals completely.  Incision Care  Shower every day. Clean your incision with mild soap and water. Pat the area dry with a clean towel. You do not need a bandage unless otherwise instructed. Do not apply any ointments or creams to your incision. You may have skin glue on your incision. Do not peel it off. It will come off on its own in about one week. Your incision may feel thickened and raised for several weeks after your surgery. This is normal and the skin will soften over time. For Men Only: It's OK to shave around the incision but do not shave the incision itself for 2 weeks. It is common to have numbness under your chin that could last for several months.  Diet  Resume your normal diet. There are no special food restrictions following this procedure. A low fat/low cholesterol diet is recommended for all patients with vascular disease. In order to heal from your surgery, it is CRITICAL to get adequate nutrition. Your body requires vitamins, minerals, and protein. Vegetables are the best  source of vitamins and minerals. Vegetables also provide the perfect balance of protein. Processed food has little nutritional value, so try to avoid this.        Medications  Resume taking all of your medications unless your doctor or physician assistant tells you not to. If your incision is causing pain, you may take over-the- counter pain relievers such as acetaminophen (Tylenol). If you were prescribed a stronger pain medication, please be aware these medications can cause nausea and constipation. Prevent nausea by taking the medication with a snack or meal. Avoid constipation by drinking plenty of fluids and eating foods with a high amount of fiber, such as fruits, vegetables, and grains. Do not take Tylenol if you are taking prescription pain medications.  Follow Up  Our office will schedule a follow up appointment 2-3 weeks following discharge.  Please call us immediately for any of the following conditions  Increased pain, redness, drainage (pus) from your incision site. Fever of 101 degrees or higher. If you should develop stroke (slurred speech, difficulty swallowing, weakness on one side of your body, loss of vision) you should call 911 and go to the nearest emergency room.  Reduce your risk of vascular disease:  Stop smoking. If you would like help call [facility] at 1-800-QUIT-NOW (1-800-784-8669) or [place] at 336-586-4000. Manage your cholesterol Maintain a desired weight Control your diabetes Keep your blood pressure down  If you have any questions, please call the office at [phone].   

## 2018-09-04 NOTE — Anesthesia Procedure Notes (Signed)
Arterial Line Insertion Start/End11/05/2018 10:25 AM, 09/04/2018 10:30 AM Performed by: Colin Benton, CRNA, CRNA  Patient location: Pre-op. Preanesthetic checklist: patient identified, IV checked, site marked, risks and benefits discussed, surgical consent, monitors and equipment checked, pre-op evaluation, timeout performed and anesthesia consent Lidocaine 1% used for infiltration Left, radial was placed Catheter size: 20 G Hand hygiene performed , maximum sterile barriers used  and Seldinger technique used Allen's test indicative of satisfactory collateral circulation Attempts: 1 Procedure performed without using ultrasound guided technique. Following insertion, dressing applied and Biopatch. Post procedure assessment: normal and unchanged  Patient tolerated the procedure well with no immediate complications.

## 2018-09-04 NOTE — Op Note (Signed)
    OPERATIVE REPORT  DATE OF SURGERY: 09/04/2018  PATIENT: Miguel Daniels, 77 y.o. male MRN: [id number]  DOB: 11-14-1940  PRE-OPERATIVE DIAGNOSIS: Symptomatic left carotid stenosis  POST-OPERATIVE DIAGNOSIS:  Same  PROCEDURE: Left carotid endarterectomy and Dacron patch angioplasty  SURGEON:  Curt Jews, M.D.  PHYSICIAN ASSISTANT: Laurence Slate, PA-C  ANESTHESIA: General  EBL: per anesthesia record  Total I/O In: 1400 [I.V.:1400] Out: 20 [Blood:20]  BLOOD ADMINISTERED: none  DRAINS: none  SPECIMEN: none  COUNTS CORRECT:  YES  PATIENT DISPOSITION:  PACU - hemodynamically stable  PROCEDURE DETAILS: Patient was taken to the operating placed supine position where the area of the left neck is prepped draped in usual sterile fashion.  An incision was made anterior to the sternocleidomastoid and carried down through the platysma.  The sternocleidomastoid reflected posteriorly and the carotid sheath was opened.  The vagus nerve was identified and preserved.  The common carotid artery was encircled with an umbilical tape and Rummel tourniquet.  Dissection was tented onto the bifurcation.  The superior thyroid artery was encircled with a 2-0 silk Potts tie.  The external carotid was encircled with a blue vessel loop and the internal carotid was encircled with an umbilical tape and Rummel tourniquet.  The hypoglossal nerve was identified and preserved.  The patient was given 7000 units of intravenous heparin and after adequate circulation time the internal/external and common carotid arteries were occluded.  The common carotid artery was opened with an 11 blade and sent longitudinally with Potts scissors.  The patient did have a critical stenosis and that looked like there had been a bleed into the plaque.  The artery above and below was normal.  A 10 shunt was passed up the internal carotid.  Allowed to backbleed and then down the common carotid where secured with Rummel tourniquet's.  The  endarterectomy was begun on the common carotid artery and the plaque was divided proximally with Potts scissors.  The endarterectomy was extended onto the bifurcation.  The external carotid was endarterectomized and eversion technique and the internal carotid and open fashion.  Remaining atheromatous debris was removed from the endarterectomy plane.  A Finesse Hemashield Dacron patch was brought onto the field and was sewn as a patch angioplasty with a running 6-0 Prolene suture.  Prior to completion of the closure the shunt was removed and the usual flushing maneuvers were undertaken.  The anastomosis completed and flow was restored first to the external and the internal carotid arteries.  Excellent flow characteristics were noted with hand-held Doppler in the internal and external carotid arteries.  The patient was given 50 mg of protamine to reverse heparin.  Wounds irrigated with saline.  Hemostasis electrocautery.  Wounds were closed with 3-0 Vicryl to reapproximate sternocleidomastoid over the carotid sheath.  Next the platysma was closed with running 3-0 Vicryl suture and find the skin was closed with a 4-0 subcuticular Vicryl stitch.  Sterile dressing was applied and the patient was transferred to the recovery room in stable condition   Rosetta Posner, M.D., [facility] 09/04/2018 3:23 PM

## 2018-09-04 NOTE — Transfer of Care (Signed)
Immediate Anesthesia Transfer of Care Note  Patient: Miguel Daniels  Procedure(s) Performed: ENDARTERECTOMY CAROTID LEFT (Left Neck) PATCH ANGIOPLASTY LEFT CAROTID ATERY USING HEMASHIELD PLATINUM FINESSE PATCH (Neck)  Patient Location: PACU  Anesthesia Type:General  Level of Consciousness: awake, alert  and oriented  Airway & Oxygen Therapy: Patient Spontanous Breathing and Patient connected to nasal cannula oxygen  Post-op Assessment: Report given to RN, Post -op Vital signs reviewed and stable, Patient moving all extremities, Patient moving all extremities X 4 and Patient able to stick tongue midline  Post vital signs: Reviewed and stable  Last Vitals:  Vitals Value Taken Time  BP 113/50   Temp    Pulse 59   Resp 17   SpO2 99     Last Pain:  Vitals:   09/04/18 1035  TempSrc:   PainSc: 0-No pain         Complications: No apparent anesthesia complications

## 2018-09-05 LAB — CBC
HCT: 31.4 % — ABNORMAL LOW (ref 39.0–52.0)
Hemoglobin: 10.6 g/dL — ABNORMAL LOW (ref 13.0–17.0)
MCH: 31.2 pg (ref 26.0–34.0)
MCHC: 33.8 g/dL (ref 30.0–36.0)
MCV: 92.4 fL (ref 80.0–100.0)
NRBC: 0 % (ref 0.0–0.2)
PLATELETS: 160 10*3/uL (ref 150–400)
RBC: 3.4 MIL/uL — AB (ref 4.22–5.81)
RDW: 12.9 % (ref 11.5–15.5)
WBC: 9.6 10*3/uL (ref 4.0–10.5)

## 2018-09-05 LAB — BASIC METABOLIC PANEL
ANION GAP: 5 (ref 5–15)
BUN: 15 mg/dL (ref 8–23)
CALCIUM: 8.6 mg/dL — AB (ref 8.9–10.3)
CO2: 25 mmol/L (ref 22–32)
Chloride: 104 mmol/L (ref 98–111)
Creatinine, Ser: 0.98 mg/dL (ref 0.61–1.24)
GFR calc Af Amer: >60 mL/min (ref >60)
Glucose, Bld: 124 mg/dL — ABNORMAL HIGH (ref 70–99)
POTASSIUM: 3.7 mmol/L (ref 3.5–5.1)
SODIUM: 134 mmol/L — AB (ref 135–145)

## 2018-09-05 MED ORDER — OXYCODONE-ACETAMINOPHEN 5-325 MG PO TABS: 1.0000 | ORAL_TABLET | Freq: Four times a day (QID) | ORAL | 0 refills | Status: DC | PRN

## 2018-09-05 NOTE — Progress Notes (Signed)
D/C instructions given to patient. Medications and wound care reviewed. All questions answered. Telemetry removed, IV x2 removed, clean and intact. Girlfriend to escort pt home.  Clyde Canterbury, RN

## 2018-09-05 NOTE — Progress Notes (Signed)
Pt walked 275 in hallway independently. Pt tolerated well. Returned to [employer]. Will continue to monitor.  Clyde Canterbury, RN

## 2018-09-05 NOTE — Progress Notes (Signed)
Removed pt arterial line from left radial. Site covered with gauze and tape. Tolerated well.

## 2018-09-05 NOTE — Progress Notes (Signed)
[facility] [place]  Subjective  - NAE overnight.  Left neck looks good.   Neuro intact.   Objective (!) 103/55 63 97.8 F (36.6 C) (Oral) 18 98%  Intake/Output Summary (Last 24 hours) at 09/05/2018 0818 Last data filed at 09/05/2018 0400 Gross per 24 hour  Intake 4025 ml  Output 370 ml  Net 3655 ml    General NAD, resting Left neck c/d/i, no hematoma CN II-XII grossly intact, no deficits  Laboratory Lab Results: Recent Labs    09/04/18 1825 09/05/18 0455  WBC 9.0 9.6  HGB 11.5* 10.6*  HCT 35.3* 31.4*  PLT 181 160   BMET Recent Labs    09/02/18 1347 09/04/18 1825 09/05/18 0455  NA 134*  --  134*  K 3.7  --  3.7  CL 98  --  104  CO2 27  --  25  GLUCOSE 93  --  124*  BUN 13  --  15  CREATININE 0.84 0.93 0.98  CALCIUM 9.6  --  8.6*    COAG Lab Results  Component Value Date   INR 1.01 09/02/2018   No results found for: PTT  Assessment/Planning: Doing well POD#1 s/p L CEA.  No deficits and neck looks good.  Discharge home today.  Follow-up 2-3 weeks in clinic for wound check of neck. Hr 50's overnight and pt asymptomatic and states normal for his HR at home.  Marty Heck 09/05/2018 8:18 AM --

## 2018-09-07 ENCOUNTER — Telehealth: Payer: Self-pay | Admitting: Vascular Surgery

## 2018-09-07 ENCOUNTER — Encounter (HOSPITAL_COMMUNITY): Payer: Self-pay | Admitting: Vascular Surgery

## 2018-09-07 NOTE — Telephone Encounter (Signed)
-----   Message from Mena Goes, RN sent at 09/04/2018  3:10 PM EST ----- Regarding: 2-3 weeks postop CEA   ----- Message ----- From: Ulyses Amor, PA-C Sent: 09/04/2018   2:34 PM EST To: [facility] Admin Pool, [facility] Charge Pool   S/P left CEA by Dr. Donnetta Hutching f/u with DR. Early in 2-3 weeks

## 2018-09-07 NOTE — Telephone Encounter (Signed)
scha ppt phone NA mld ltr 09/29/18 3pm p/o MD

## 2018-09-07 NOTE — Discharge Summary (Signed)
[facility] Discharge Summary   Patient ID:  Miguel Daniels MRN: [id number] DOB/AGE: Jul 26, 1941 77 y.o.  Admit date: 09/04/2018 Discharge date: 09/05/2018 Date of Surgery: 09/04/2018 Surgeon: Surgeon(s): Early, Arvilla Meres, MD  Admission Diagnosis: LEFT CAROTID ARTERY STENOSIS  Discharge Diagnoses:  LEFT CAROTID ARTERY STENOSIS  Secondary Diagnoses: Past Medical History:  Diagnosis Date  . Acid reflux   . Arthritis   . Hepatitis   . High blood pressure   . Migraines   . Osteoporosis   . Stenosis of carotid artery   . Stroke (cerebrum) ([place])   . TIA (transient ischemic attack) 08/12/2015    Procedure(s): ENDARTERECTOMY CAROTID LEFT PATCH ANGIOPLASTY LEFT CAROTID ATERY USING HEMASHIELD PLATINUM FINESSE PATCH  Discharged Condition: stable  HPI: Miguel Daniels is a 78 y.o. male, who is here today for evaluation of recent left brain event.  The prior history of intracranial bleed approximately 14 years ago.  He has a continuing issue regarding memory associated with this.  He has a friend with him who is very helpful in providing details as well.  He reportedly was found unresponsive by family and was taken to [facility] for further evaluation.  This was in mid October.  Apparently there was some slurred speech and right-sided weakness according to the notes from the hospital.  He reports that he is returned to his baseline.  Work-up at the hospital included duplex and also CT angiogram of his head and neck.  The CT and angiogram was available for my review.  After review of the CTA Dr. Donnetta Hutching found critical stenosis at his left bifurcation.  He was scheduled for Left CEA.   Hospital Course:  Miguel Daniels is a 77 y.o. male is S/P  Procedure(s): ENDARTERECTOMY CAROTID LEFT PATCH ANGIOPLASTY LEFT CAROTID ATERY USING HEMASHIELD PLATINUM FINESSE PATCH  POD# 1 his stay was uneventful without neuro deficits, incision healing well without hematoma.  He was  discharged home in stable condition.      Significant Diagnostic Studies: CBC Lab Results  Component Value Date   WBC 9.6 09/05/2018   HGB 10.6 (L) 09/05/2018   HCT 31.4 (L) 09/05/2018   MCV 92.4 09/05/2018   PLT 160 09/05/2018    BMET    Component Value Date/Time   NA 134 (L) 09/05/2018 0455   K 3.7 09/05/2018 0455   CL 104 09/05/2018 0455   CO2 25 09/05/2018 0455   GLUCOSE 124 (H) 09/05/2018 0455   BUN 15 09/05/2018 0455   CREATININE 0.98 09/05/2018 0455   CALCIUM 8.6 (L) 09/05/2018 0455   GFRNONAA >60 09/05/2018 0455   GFRAA >60 09/05/2018 0455   COAG Lab Results  Component Value Date   INR 1.01 09/02/2018     Disposition:  Discharge to :Home  Allergies as of 09/05/2018      Reactions   Latex Hives      Medication List    TAKE these medications   aspirin 81 MG chewable tablet Chew 81 mg by mouth daily.   baclofen 10 MG tablet Commonly known as:  LIORESAL Take 10 mg by mouth 3 (three) times daily.   BREO ELLIPTA 100-25 MCG/INH Aepb Generic drug:  fluticasone furoate-vilanterol Inhale 1 puff into the lungs daily.   Calcium-Vitamin D3 600-500 MG-UNIT Caps Take 1 tablet by mouth daily.   Fish Oil 1000 MG Caps Take 1,000 mg by mouth 2 (two) times daily.   FLUoxetine 20 MG capsule Commonly known as:  PROZAC Take  20 mg by mouth daily.   folic acid 619 MCG tablet Commonly known as:  FOLVITE Take 800 mcg by mouth daily.   hydrochlorothiazide 25 MG tablet Commonly known as:  HYDRODIURIL Take 25 mg by mouth daily.   lisinopril 40 MG tablet Commonly known as:  PRINIVIL,ZESTRIL Take 10 mg by mouth daily.   MULTIVITAMIN PO Take 1 tablet by mouth daily.   oxyCODONE-acetaminophen 5-325 MG tablet Commonly known as:  PERCOCET/ROXICET Take 1 tablet by mouth every 6 (six) hours as needed.   PROBIOTIC PO Take 1 capsule by mouth daily.   Red Yeast Rice 600 MG Caps Take 600 mg by mouth 2 (two) times daily.   traZODone 50 MG tablet Commonly  known as:  DESYREL Take 50 mg by mouth at bedtime.   vitamin C 500 MG tablet Commonly known as:  ASCORBIC ACID Take 500 mg by mouth daily.      Verbal and written Discharge instructions given to the patient. Wound care per Discharge AVS Follow-up Information    Early, Arvilla Meres, MD In 2 weeks.   Specialties:  Vascular Surgery, Cardiology Why:  Office will call you to arrange your appt (sent) Contact information: [place]  [phone]           Signed: Roxy Horseman 09/07/2018, 9:18 AM --- For VQI Registry use --- Instructions: Press F2 to tab through selections.  Delete question if not applicable.   Modified Rankin score at D/C (0-6): Rankin Score=0  IV medication needed for:  1. Hypertension: No 2. Hypotension: No  Post-op Complications: No  1. Post-op CVA or TIA: No  If yes: Event classification (right eye, left eye, right cortical, left cortical, verterobasilar, other):   If yes: Timing of event (intra-op, <6 hrs post-op, >=6 hrs post-op, unknown):   2. CN injury: No  If yes: CN  injuried   3. Myocardial infarction: No  If yes: Dx by (EKG or clinical, Troponin):   4.  CHF: No  5.  Dysrhythmia (new): No  6. Wound infection: No  7. Reperfusion symptoms: No  8. Return to OR: No  If yes: return to OR for (bleeding, neurologic, other CEA incision, other):   Discharge medications: Statin use:  No  for medical reason   ASA use:  Yes Beta blocker use:  No  for medical reason   ACE-Inhibitor use:  Yes P2Y12 Antagonist use: [x ] None, [ ]  Plavix, [ ]  Plasugrel, [ ]  Ticlopinine, [ ]  Ticagrelor, [ ]  Other, [ ]  No for medical reason, [ ]  Non-compliant, [ ]  Not-indicated Anti-coagulant use:  [x ] None, [ ]  Warfarin, [ ]  Rivaroxaban, [ ]  Dabigatran, [ ]  Other, [ ]  No for medical reason, [ ]  Non-compliant, [ ]  Not-indicated

## 2018-09-22 DIAGNOSIS — Z87891 Personal history of nicotine dependence: Secondary | ICD-10-CM | POA: Diagnosis not present

## 2018-09-22 DIAGNOSIS — Z681 Body mass index (BMI) 19 or less, adult: Secondary | ICD-10-CM | POA: Diagnosis not present

## 2018-09-22 DIAGNOSIS — I10 Essential (primary) hypertension: Secondary | ICD-10-CM | POA: Diagnosis not present

## 2018-09-22 DIAGNOSIS — J449 Chronic obstructive pulmonary disease, unspecified: Secondary | ICD-10-CM | POA: Diagnosis not present

## 2018-09-22 DIAGNOSIS — I6529 Occlusion and stenosis of unspecified carotid artery: Secondary | ICD-10-CM | POA: Diagnosis not present

## 2018-09-22 DIAGNOSIS — Z299 Encounter for prophylactic measures, unspecified: Secondary | ICD-10-CM | POA: Diagnosis not present

## 2018-09-29 ENCOUNTER — Ambulatory Visit (INDEPENDENT_AMBULATORY_CARE_PROVIDER_SITE_OTHER): Payer: Self-pay | Admitting: Vascular Surgery

## 2018-09-29 ENCOUNTER — Other Ambulatory Visit: Payer: Self-pay

## 2018-09-29 ENCOUNTER — Encounter: Payer: Self-pay | Admitting: Vascular Surgery

## 2018-09-29 VITALS — BP 120/79 | HR 60 | Temp 97.9°F | Resp 16 | Ht 71.0 in | Wt 137.8 lb

## 2018-09-29 DIAGNOSIS — I6522 Occlusion and stenosis of left carotid artery: Secondary | ICD-10-CM

## 2018-09-29 NOTE — Progress Notes (Signed)
   Patient name: Miguel Daniels MRN: [id number] DOB: 1941/08/02 Sex: male  REASON FOR VISIT: Follow-up left carotid endarterectomy for symptomatic carotid disease on 09/04/2018  HPI: Miguel Daniels is a 77 y.o. male here today for follow-up.  He had an uneventful endarterectomy and was discharged home on postoperative day #1.  He is here today with his friend.  They report that he did not fill his narcotic prescription and was able to tolerate discomfort with treatment with over-the-counter pain medication.  He has had no new neurologic deficit  Current Outpatient Medications  Medication Sig Dispense Refill  . aspirin 81 MG chewable tablet Chew 81 mg by mouth daily.     . baclofen (LIORESAL) 10 MG tablet Take 10 mg by mouth 3 (three) times daily.  3  . BREO ELLIPTA 100-25 MCG/INH AEPB Inhale 1 puff into the lungs daily.   6  . Calcium Carb-Cholecalciferol (CALCIUM-VITAMIN D3) 600-500 MG-UNIT CAPS Take 1 tablet by mouth daily.    Marland Kitchen FLUoxetine (PROZAC) 20 MG capsule Take 20 mg by mouth daily.     . folic acid (FOLVITE) 354 MCG tablet Take 800 mcg by mouth daily.    . hydrochlorothiazide (HYDRODIURIL) 25 MG tablet Take 25 mg by mouth daily.     Marland Kitchen lisinopril (PRINIVIL,ZESTRIL) 40 MG tablet Take 10 mg by mouth daily.     . Multiple Vitamins-Minerals (MULTIVITAMIN PO) Take 1 tablet by mouth daily.    . Omega-3 Fatty Acids (FISH OIL) 1000 MG CAPS Take 1,000 mg by mouth 2 (two) times daily.     . Probiotic Product (PROBIOTIC PO) Take 1 capsule by mouth daily.    . Red Yeast Rice 600 MG CAPS Take 600 mg by mouth 2 (two) times daily.     . traZODone (DESYREL) 50 MG tablet Take 50 mg by mouth at bedtime.     . vitamin C (ASCORBIC ACID) 500 MG tablet Take 500 mg by mouth daily.      No current facility-administered medications for this visit.      PHYSICAL EXAM: Vitals:   09/29/18 1504 09/29/18 1506  BP: 120/77 120/79  Pulse: 60   Resp: 16   Temp: 97.9 F (36.6 C)     TempSrc: Oral   SpO2: 93%   Weight: 137 lb 12.8 oz (62.5 kg)   Height: 5\' 11"  (1.803 m)     GENERAL: The patient is a well-nourished male, in no acute distress. The vital signs are documented above. Left neck incision is well-healed.  Grossly intact neurologically  MEDICAL ISSUES: Stable status post left endarterectomy for symptomatic disease.  We will continue his usual activities and we will see him again in 9 months with repeat carotid duplex evaluation   Rosetta Posner, MD [facility] Tel [phone] Pager [phone]

## 2018-12-23 DIAGNOSIS — J449 Chronic obstructive pulmonary disease, unspecified: Secondary | ICD-10-CM | POA: Diagnosis not present

## 2018-12-23 DIAGNOSIS — I10 Essential (primary) hypertension: Secondary | ICD-10-CM | POA: Diagnosis not present

## 2018-12-23 DIAGNOSIS — Z299 Encounter for prophylactic measures, unspecified: Secondary | ICD-10-CM | POA: Diagnosis not present

## 2018-12-23 DIAGNOSIS — M5417 Radiculopathy, lumbosacral region: Secondary | ICD-10-CM | POA: Diagnosis not present

## 2019-01-07 DIAGNOSIS — G894 Chronic pain syndrome: Secondary | ICD-10-CM | POA: Diagnosis not present

## 2019-01-07 DIAGNOSIS — M5030 Other cervical disc degeneration, unspecified cervical region: Secondary | ICD-10-CM | POA: Diagnosis not present

## 2019-01-07 DIAGNOSIS — I6930 Unspecified sequelae of cerebral infarction: Secondary | ICD-10-CM | POA: Diagnosis not present

## 2019-01-07 DIAGNOSIS — M47816 Spondylosis without myelopathy or radiculopathy, lumbar region: Secondary | ICD-10-CM | POA: Diagnosis not present

## 2019-01-07 DIAGNOSIS — M9981 Other biomechanical lesions of cervical region: Secondary | ICD-10-CM | POA: Diagnosis not present

## 2019-01-07 DIAGNOSIS — M545 Low back pain: Secondary | ICD-10-CM | POA: Diagnosis not present

## 2019-01-07 DIAGNOSIS — M48061 Spinal stenosis, lumbar region without neurogenic claudication: Secondary | ICD-10-CM | POA: Diagnosis not present

## 2019-01-07 DIAGNOSIS — M47812 Spondylosis without myelopathy or radiculopathy, cervical region: Secondary | ICD-10-CM | POA: Diagnosis not present

## 2019-01-26 DIAGNOSIS — M5136 Other intervertebral disc degeneration, lumbar region: Secondary | ICD-10-CM | POA: Diagnosis not present

## 2019-01-26 DIAGNOSIS — M47816 Spondylosis without myelopathy or radiculopathy, lumbar region: Secondary | ICD-10-CM | POA: Diagnosis not present

## 2019-03-11 DIAGNOSIS — M5030 Other cervical disc degeneration, unspecified cervical region: Secondary | ICD-10-CM | POA: Diagnosis not present

## 2019-03-11 DIAGNOSIS — M48061 Spinal stenosis, lumbar region without neurogenic claudication: Secondary | ICD-10-CM | POA: Diagnosis not present

## 2019-03-11 DIAGNOSIS — M9981 Other biomechanical lesions of cervical region: Secondary | ICD-10-CM | POA: Diagnosis not present

## 2019-03-11 DIAGNOSIS — M47816 Spondylosis without myelopathy or radiculopathy, lumbar region: Secondary | ICD-10-CM | POA: Diagnosis not present

## 2019-03-11 DIAGNOSIS — G894 Chronic pain syndrome: Secondary | ICD-10-CM | POA: Diagnosis not present

## 2019-03-11 DIAGNOSIS — M47812 Spondylosis without myelopathy or radiculopathy, cervical region: Secondary | ICD-10-CM | POA: Diagnosis not present

## 2019-03-11 DIAGNOSIS — M545 Low back pain: Secondary | ICD-10-CM | POA: Diagnosis not present

## 2019-07-01 DIAGNOSIS — Z79899 Other long term (current) drug therapy: Secondary | ICD-10-CM | POA: Diagnosis not present

## 2019-07-01 DIAGNOSIS — R5383 Other fatigue: Secondary | ICD-10-CM | POA: Diagnosis not present

## 2019-07-01 DIAGNOSIS — Z125 Encounter for screening for malignant neoplasm of prostate: Secondary | ICD-10-CM | POA: Diagnosis not present

## 2019-07-01 DIAGNOSIS — E7800 Pure hypercholesterolemia, unspecified: Secondary | ICD-10-CM | POA: Diagnosis not present

## 2019-07-01 DIAGNOSIS — I10 Essential (primary) hypertension: Secondary | ICD-10-CM | POA: Diagnosis not present

## 2019-07-01 DIAGNOSIS — Z1211 Encounter for screening for malignant neoplasm of colon: Secondary | ICD-10-CM | POA: Diagnosis not present

## 2019-07-01 DIAGNOSIS — Z299 Encounter for prophylactic measures, unspecified: Secondary | ICD-10-CM | POA: Diagnosis not present

## 2019-07-01 DIAGNOSIS — Z681 Body mass index (BMI) 19 or less, adult: Secondary | ICD-10-CM | POA: Diagnosis not present

## 2019-07-01 DIAGNOSIS — J449 Chronic obstructive pulmonary disease, unspecified: Secondary | ICD-10-CM | POA: Diagnosis not present

## 2019-07-01 DIAGNOSIS — Z7189 Other specified counseling: Secondary | ICD-10-CM | POA: Diagnosis not present

## 2019-07-01 DIAGNOSIS — Z1339 Encounter for screening examination for other mental health and behavioral disorders: Secondary | ICD-10-CM | POA: Diagnosis not present

## 2019-07-01 DIAGNOSIS — Z0000 Encounter for general adult medical examination without abnormal findings: Secondary | ICD-10-CM | POA: Diagnosis not present

## 2019-07-01 DIAGNOSIS — Z1331 Encounter for screening for depression: Secondary | ICD-10-CM | POA: Diagnosis not present

## 2019-07-16 DIAGNOSIS — H25013 Cortical age-related cataract, bilateral: Secondary | ICD-10-CM | POA: Diagnosis not present

## 2019-07-16 DIAGNOSIS — H40013 Open angle with borderline findings, low risk, bilateral: Secondary | ICD-10-CM | POA: Diagnosis not present

## 2019-07-16 DIAGNOSIS — H04123 Dry eye syndrome of bilateral lacrimal glands: Secondary | ICD-10-CM | POA: Diagnosis not present

## 2019-07-16 DIAGNOSIS — H53461 Homonymous bilateral field defects, right side: Secondary | ICD-10-CM | POA: Diagnosis not present

## 2019-08-10 DIAGNOSIS — Z681 Body mass index (BMI) 19 or less, adult: Secondary | ICD-10-CM | POA: Diagnosis not present

## 2019-08-10 DIAGNOSIS — Z299 Encounter for prophylactic measures, unspecified: Secondary | ICD-10-CM | POA: Diagnosis not present

## 2019-08-10 DIAGNOSIS — G8191 Hemiplegia, unspecified affecting right dominant side: Secondary | ICD-10-CM | POA: Diagnosis not present

## 2019-08-10 DIAGNOSIS — J449 Chronic obstructive pulmonary disease, unspecified: Secondary | ICD-10-CM | POA: Diagnosis not present

## 2019-08-10 DIAGNOSIS — I10 Essential (primary) hypertension: Secondary | ICD-10-CM | POA: Diagnosis not present

## 2019-08-10 DIAGNOSIS — F322 Major depressive disorder, single episode, severe without psychotic features: Secondary | ICD-10-CM | POA: Diagnosis not present

## 2019-09-13 DIAGNOSIS — J449 Chronic obstructive pulmonary disease, unspecified: Secondary | ICD-10-CM | POA: Diagnosis not present

## 2019-09-13 DIAGNOSIS — Z299 Encounter for prophylactic measures, unspecified: Secondary | ICD-10-CM | POA: Diagnosis not present

## 2019-09-13 DIAGNOSIS — M19071 Primary osteoarthritis, right ankle and foot: Secondary | ICD-10-CM | POA: Diagnosis not present

## 2019-09-13 DIAGNOSIS — G8191 Hemiplegia, unspecified affecting right dominant side: Secondary | ICD-10-CM | POA: Diagnosis not present

## 2019-09-13 DIAGNOSIS — M79671 Pain in right foot: Secondary | ICD-10-CM | POA: Diagnosis not present

## 2019-09-13 DIAGNOSIS — Z87891 Personal history of nicotine dependence: Secondary | ICD-10-CM | POA: Diagnosis not present

## 2019-09-13 DIAGNOSIS — M2041 Other hammer toe(s) (acquired), right foot: Secondary | ICD-10-CM | POA: Diagnosis not present

## 2019-09-13 DIAGNOSIS — Z681 Body mass index (BMI) 19 or less, adult: Secondary | ICD-10-CM | POA: Diagnosis not present

## 2019-09-30 DIAGNOSIS — M778 Other enthesopathies, not elsewhere classified: Secondary | ICD-10-CM | POA: Diagnosis not present

## 2019-09-30 DIAGNOSIS — M79671 Pain in right foot: Secondary | ICD-10-CM | POA: Diagnosis not present

## 2019-12-26 DIAGNOSIS — I10 Essential (primary) hypertension: Secondary | ICD-10-CM | POA: Diagnosis not present

## 2019-12-26 DIAGNOSIS — E039 Hypothyroidism, unspecified: Secondary | ICD-10-CM | POA: Diagnosis not present

## 2019-12-30 DIAGNOSIS — I10 Essential (primary) hypertension: Secondary | ICD-10-CM | POA: Diagnosis not present

## 2019-12-30 DIAGNOSIS — Z713 Dietary counseling and surveillance: Secondary | ICD-10-CM | POA: Diagnosis not present

## 2019-12-30 DIAGNOSIS — Z299 Encounter for prophylactic measures, unspecified: Secondary | ICD-10-CM | POA: Diagnosis not present

## 2019-12-30 DIAGNOSIS — Z681 Body mass index (BMI) 19 or less, adult: Secondary | ICD-10-CM | POA: Diagnosis not present

## 2020-01-21 DIAGNOSIS — G8191 Hemiplegia, unspecified affecting right dominant side: Secondary | ICD-10-CM | POA: Diagnosis not present

## 2020-01-21 DIAGNOSIS — J449 Chronic obstructive pulmonary disease, unspecified: Secondary | ICD-10-CM | POA: Diagnosis not present

## 2020-01-21 DIAGNOSIS — S22000D Wedge compression fracture of unspecified thoracic vertebra, subsequent encounter for fracture with routine healing: Secondary | ICD-10-CM | POA: Diagnosis not present

## 2020-01-21 DIAGNOSIS — Z299 Encounter for prophylactic measures, unspecified: Secondary | ICD-10-CM | POA: Diagnosis not present

## 2020-01-21 DIAGNOSIS — I10 Essential (primary) hypertension: Secondary | ICD-10-CM | POA: Diagnosis not present

## 2020-01-21 DIAGNOSIS — M546 Pain in thoracic spine: Secondary | ICD-10-CM | POA: Diagnosis not present

## 2020-01-26 DIAGNOSIS — G8191 Hemiplegia, unspecified affecting right dominant side: Secondary | ICD-10-CM | POA: Diagnosis not present

## 2020-01-26 DIAGNOSIS — S22000A Wedge compression fracture of unspecified thoracic vertebra, initial encounter for closed fracture: Secondary | ICD-10-CM | POA: Diagnosis not present

## 2020-01-26 DIAGNOSIS — Z299 Encounter for prophylactic measures, unspecified: Secondary | ICD-10-CM | POA: Diagnosis not present

## 2020-01-26 DIAGNOSIS — I10 Essential (primary) hypertension: Secondary | ICD-10-CM | POA: Diagnosis not present

## 2020-01-26 DIAGNOSIS — J449 Chronic obstructive pulmonary disease, unspecified: Secondary | ICD-10-CM | POA: Diagnosis not present

## 2020-02-03 DIAGNOSIS — M546 Pain in thoracic spine: Secondary | ICD-10-CM | POA: Diagnosis not present

## 2020-02-03 DIAGNOSIS — R262 Difficulty in walking, not elsewhere classified: Secondary | ICD-10-CM | POA: Diagnosis not present

## 2020-02-03 DIAGNOSIS — M6281 Muscle weakness (generalized): Secondary | ICD-10-CM | POA: Diagnosis not present

## 2020-02-03 DIAGNOSIS — M545 Low back pain: Secondary | ICD-10-CM | POA: Diagnosis not present

## 2020-02-09 DIAGNOSIS — M40204 Unspecified kyphosis, thoracic region: Secondary | ICD-10-CM | POA: Diagnosis not present

## 2020-02-09 DIAGNOSIS — M47814 Spondylosis without myelopathy or radiculopathy, thoracic region: Secondary | ICD-10-CM | POA: Diagnosis not present

## 2020-02-09 DIAGNOSIS — M546 Pain in thoracic spine: Secondary | ICD-10-CM | POA: Diagnosis not present

## 2020-02-25 DIAGNOSIS — I10 Essential (primary) hypertension: Secondary | ICD-10-CM | POA: Diagnosis not present

## 2020-02-25 DIAGNOSIS — R42 Dizziness and giddiness: Secondary | ICD-10-CM | POA: Diagnosis not present

## 2020-02-28 DIAGNOSIS — M546 Pain in thoracic spine: Secondary | ICD-10-CM | POA: Diagnosis not present

## 2020-02-28 DIAGNOSIS — M6281 Muscle weakness (generalized): Secondary | ICD-10-CM | POA: Diagnosis not present

## 2020-02-28 DIAGNOSIS — R262 Difficulty in walking, not elsewhere classified: Secondary | ICD-10-CM | POA: Diagnosis not present

## 2020-02-28 DIAGNOSIS — M545 Low back pain: Secondary | ICD-10-CM | POA: Diagnosis not present

## 2020-02-29 DIAGNOSIS — M4804 Spinal stenosis, thoracic region: Secondary | ICD-10-CM | POA: Diagnosis not present

## 2020-02-29 DIAGNOSIS — G950 Syringomyelia and syringobulbia: Secondary | ICD-10-CM | POA: Diagnosis not present

## 2020-03-13 DIAGNOSIS — M6281 Muscle weakness (generalized): Secondary | ICD-10-CM | POA: Diagnosis not present

## 2020-03-27 DIAGNOSIS — I10 Essential (primary) hypertension: Secondary | ICD-10-CM | POA: Diagnosis not present

## 2020-03-27 DIAGNOSIS — M546 Pain in thoracic spine: Secondary | ICD-10-CM | POA: Diagnosis not present

## 2020-04-02 DIAGNOSIS — I491 Atrial premature depolarization: Secondary | ICD-10-CM | POA: Diagnosis not present

## 2020-04-02 DIAGNOSIS — G9389 Other specified disorders of brain: Secondary | ICD-10-CM | POA: Diagnosis not present

## 2020-04-02 DIAGNOSIS — M1990 Unspecified osteoarthritis, unspecified site: Secondary | ICD-10-CM | POA: Diagnosis not present

## 2020-04-02 DIAGNOSIS — R569 Unspecified convulsions: Secondary | ICD-10-CM | POA: Diagnosis not present

## 2020-04-02 DIAGNOSIS — I10 Essential (primary) hypertension: Secondary | ICD-10-CM | POA: Diagnosis not present

## 2020-04-02 DIAGNOSIS — Z8673 Personal history of transient ischemic attack (TIA), and cerebral infarction without residual deficits: Secondary | ICD-10-CM | POA: Diagnosis not present

## 2020-04-02 DIAGNOSIS — Z87891 Personal history of nicotine dependence: Secondary | ICD-10-CM | POA: Diagnosis not present

## 2020-04-02 DIAGNOSIS — J449 Chronic obstructive pulmonary disease, unspecified: Secondary | ICD-10-CM | POA: Diagnosis not present

## 2020-04-02 DIAGNOSIS — G459 Transient cerebral ischemic attack, unspecified: Secondary | ICD-10-CM | POA: Diagnosis not present

## 2020-04-02 DIAGNOSIS — R001 Bradycardia, unspecified: Secondary | ICD-10-CM | POA: Diagnosis not present

## 2020-04-04 DIAGNOSIS — I10 Essential (primary) hypertension: Secondary | ICD-10-CM | POA: Diagnosis not present

## 2020-04-04 DIAGNOSIS — J449 Chronic obstructive pulmonary disease, unspecified: Secondary | ICD-10-CM | POA: Diagnosis not present

## 2020-04-04 DIAGNOSIS — R569 Unspecified convulsions: Secondary | ICD-10-CM | POA: Diagnosis not present

## 2020-04-04 DIAGNOSIS — F322 Major depressive disorder, single episode, severe without psychotic features: Secondary | ICD-10-CM | POA: Diagnosis not present

## 2020-04-04 DIAGNOSIS — I779 Disorder of arteries and arterioles, unspecified: Secondary | ICD-10-CM | POA: Diagnosis not present

## 2020-04-04 DIAGNOSIS — Z299 Encounter for prophylactic measures, unspecified: Secondary | ICD-10-CM | POA: Diagnosis not present

## 2020-04-13 DIAGNOSIS — M4854XS Collapsed vertebra, not elsewhere classified, thoracic region, sequela of fracture: Secondary | ICD-10-CM | POA: Diagnosis not present

## 2020-04-13 DIAGNOSIS — M6281 Muscle weakness (generalized): Secondary | ICD-10-CM | POA: Diagnosis not present

## 2020-04-13 DIAGNOSIS — M546 Pain in thoracic spine: Secondary | ICD-10-CM | POA: Diagnosis not present

## 2020-04-13 DIAGNOSIS — R262 Difficulty in walking, not elsewhere classified: Secondary | ICD-10-CM | POA: Diagnosis not present

## 2020-05-11 DIAGNOSIS — M4804 Spinal stenosis, thoracic region: Secondary | ICD-10-CM | POA: Diagnosis not present

## 2020-05-11 DIAGNOSIS — G950 Syringomyelia and syringobulbia: Secondary | ICD-10-CM | POA: Diagnosis not present

## 2020-05-26 DIAGNOSIS — Z720 Tobacco use: Secondary | ICD-10-CM | POA: Diagnosis not present

## 2020-05-26 DIAGNOSIS — I10 Essential (primary) hypertension: Secondary | ICD-10-CM | POA: Diagnosis not present

## 2020-05-26 DIAGNOSIS — E785 Hyperlipidemia, unspecified: Secondary | ICD-10-CM | POA: Diagnosis not present

## 2020-05-26 DIAGNOSIS — J449 Chronic obstructive pulmonary disease, unspecified: Secondary | ICD-10-CM | POA: Diagnosis not present

## 2020-06-05 DIAGNOSIS — I779 Disorder of arteries and arterioles, unspecified: Secondary | ICD-10-CM | POA: Diagnosis not present

## 2020-06-05 DIAGNOSIS — I10 Essential (primary) hypertension: Secondary | ICD-10-CM | POA: Diagnosis not present

## 2020-06-05 DIAGNOSIS — K921 Melena: Secondary | ICD-10-CM | POA: Diagnosis not present

## 2020-06-05 DIAGNOSIS — Z681 Body mass index (BMI) 19 or less, adult: Secondary | ICD-10-CM | POA: Diagnosis not present

## 2020-06-05 DIAGNOSIS — Z299 Encounter for prophylactic measures, unspecified: Secondary | ICD-10-CM | POA: Diagnosis not present

## 2020-06-05 DIAGNOSIS — J449 Chronic obstructive pulmonary disease, unspecified: Secondary | ICD-10-CM | POA: Diagnosis not present

## 2020-06-07 ENCOUNTER — Ambulatory Visit: Payer: PPO | Admitting: Diagnostic Neuroimaging

## 2020-06-14 ENCOUNTER — Encounter: Payer: Self-pay | Admitting: *Deleted

## 2020-06-19 ENCOUNTER — Telehealth: Payer: Self-pay | Admitting: Diagnostic Neuroimaging

## 2020-06-19 ENCOUNTER — Encounter: Payer: Self-pay | Admitting: Diagnostic Neuroimaging

## 2020-06-19 ENCOUNTER — Ambulatory Visit (INDEPENDENT_AMBULATORY_CARE_PROVIDER_SITE_OTHER): Payer: PPO | Admitting: Diagnostic Neuroimaging

## 2020-06-19 VITALS — BP 174/93 | HR 56 | Ht 72.0 in | Wt 135.0 lb

## 2020-06-19 DIAGNOSIS — R259 Unspecified abnormal involuntary movements: Secondary | ICD-10-CM

## 2020-06-19 DIAGNOSIS — R410 Disorientation, unspecified: Secondary | ICD-10-CM

## 2020-06-19 MED ORDER — LEVETIRACETAM 500 MG PO TABS: 500.0000 mg | ORAL_TABLET | Freq: Two times a day (BID) | ORAL | 4 refills | Status: DC

## 2020-06-19 NOTE — Patient Instructions (Signed)
PARTIAL ONSET SEIZURE (with secondary generalization)  - start levetiracetam 500mg  twice a day   - check MRI brain, EEG  - According to [place] law, you can not drive unless you are seizure / syncope free for at least 6 months and under physician's care.   - Please maintain precautions. Do not participate in activities where a loss of awareness could harm you or someone else. No swimming alone, no tub bathing, no hot tubs, no driving, no operating motorized vehicles (cars, ATVs, motocycles, etc), lawnmowers, power tools or firearms. No standing at heights, such as rooftops, ladders or stairs. Avoid hot objects such as stoves, heaters, open fires. Wear a helmet when riding a bicycle, scooter, skateboard, etc. and avoid areas of traffic. Set your water heater to 120 degrees or less.

## 2020-06-19 NOTE — Progress Notes (Signed)
[facility]  PATIENT: Miguel Daniels DOB: March 31, 1941  REFERRING CLINICIAN: Glenda Chroman, MD HISTORY FROM: patient  REASON FOR VISIT: new consult    HISTORICAL  CHIEF COMPLAINT:  Chief Complaint  Patient presents with  . Seizures    rm 6 New Pt  friend-Karen "June I woke up, my whole body curled up, couldn't breathe, went back to sleep, woke up weak; seizure post stroke in 2007"    HISTORY OF PRESENT ILLNESS:   79 year old male with hypertension, stroke, here for evaluation of possible seizure.  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  Since that time he has done well.  04/02/2020 patient was asleep, woke up and felt his right side of his body curl up.  His head turned to the right.  Symptoms lasted for 30 seconds and then he passed out.  Afterwards he was extremely exhausted and confused.  Now patient back to baseline.  No further events.  REVIEW OF SYSTEMS: Full 14 system review of systems performed and negative with exception of: As per HPI.  ALLERGIES: Allergies  Allergen Reactions  . Latex Hives    HOME MEDICATIONS: Outpatient Medications Prior to Visit  Medication Sig Dispense Refill  . aspirin 81 MG chewable tablet Chew 81 mg by mouth daily.     . baclofen (LIORESAL) 10 MG tablet Take 10 mg by mouth 3 (three) times daily.  3  . BREO ELLIPTA 100-25 MCG/INH AEPB Inhale 1 puff into the lungs daily.   6  . Calcium Carb-Cholecalciferol (CALCIUM-VITAMIN D3) 600-500 MG-UNIT CAPS Take 1 tablet by mouth daily.    Marland Kitchen FLUoxetine (PROZAC) 20 MG capsule Take 20 mg by mouth daily.     . folic acid (FOLVITE) 073 MCG tablet Take 800 mcg by mouth daily.    . hydrochlorothiazide (HYDRODIURIL) 25 MG tablet Take 25 mg by mouth daily.     Marland Kitchen lisinopril (PRINIVIL,ZESTRIL) 40 MG tablet Take 10 mg by mouth daily.     . Multiple Vitamins-Minerals (MULTIVITAMIN PO) Take 1 tablet by mouth daily.    .  Omega-3 Fatty Acids (FISH OIL) 1000 MG CAPS Take 1,000 mg by mouth 2 (two) times daily.     . Probiotic Product (PROBIOTIC PO) Take 1 capsule by mouth daily.    . Red Yeast Rice 600 MG CAPS Take 600 mg by mouth 2 (two) times daily.     . traZODone (DESYREL) 50 MG tablet Take 50 mg by mouth at bedtime.     . vitamin C (ASCORBIC ACID) 500 MG tablet Take 500 mg by mouth daily.      No facility-administered medications prior to visit.    PAST MEDICAL HISTORY: Past Medical History:  Diagnosis Date  . Acid reflux   . Arthritis   . COPD (chronic obstructive pulmonary disease) ([place])   . Gout   . Hepatitis   . High blood pressure   . Hypercholesteremia   . Insomnia   . Migraines   . Neuropathy   . Osteoporosis   . Rheumatic heart disease    as a child  . Seizure ([place]) 2007  . Small bowel obstruction ([place])   . Stenosis of carotid artery   . Stroke (cerebrum) ([place]) 07/06/2004  . Thoracic compression fracture ([place])   . TIA (transient ischemic attack) 08/12/2015    PAST SURGICAL HISTORY: Past Surgical History:  Procedure Laterality Date  . ENDARTERECTOMY Left 1108/2019  ENDARTERECTOMY CAROTID LEFT (Left Neck)  . ENDARTERECTOMY Left 09/04/2018   Procedure: ENDARTERECTOMY CAROTID LEFT;  Surgeon: Rosetta Posner, MD;  Location: [facility];  Service: Vascular;  Laterality: Left;  . PATCH ANGIOPLASTY  09/04/2018   Procedure: PATCH ANGIOPLASTY LEFT CAROTID ATERY USING HEMASHIELD PLATINUM FINESSE PATCH;  Surgeon: Rosetta Posner, MD;  [facility];  Service: Vascular;;  . TONSILLECTOMY      FAMILY HISTORY: Family History  Problem Relation Age of Onset  . Hypertension Father   . High blood pressure Sister     SOCIAL HISTORY: Social History   Socioeconomic History  . Marital status: Single    Spouse name: Not on file  . Number of children: 0  . Years of education: Not on file  . Highest education level: Not on file  Occupational History  . Not on file  Tobacco Use  . Smoking  status: Former Smoker    Quit date: 06/15/1979    Years since quitting: 41.0  . Smokeless tobacco: Never Used  Vaping Use  . Vaping Use: Never used  Substance and Sexual Activity  . Alcohol use: No    Alcohol/week: 0.0 standard drinks  . Drug use: No  . Sexual activity: Not on file  Other Topics Concern  . Not on file  Social History Narrative   Lives with sister but stays with his friend on the weekends.  Has no children.  Retired from several different jobs.   Social Determinants of Health   Financial Resource Strain:   . Difficulty of Paying Living Expenses: Not on file  Food Insecurity:   . Worried About Charity fundraiser in the Last Year: Not on file  . Ran Out of Food in the Last Year: Not on file  Transportation Needs:   . Lack of Transportation (Medical): Not on file  . Lack of Transportation (Non-Medical): Not on file  Physical Activity:   . Days of Exercise per Week: Not on file  . Minutes of Exercise per Session: Not on file  Stress:   . Feeling of Stress : Not on file  Social Connections:   . Frequency of Communication with Friends and Family: Not on file  . Frequency of Social Gatherings with Friends and Family: Not on file  . Attends Religious Services: Not on file  . Active Member of Clubs or Organizations: Not on file  . Attends Archivist Meetings: Not on file  . Marital Status: Not on file  Intimate Partner Violence:   . Fear of Current or Ex-Partner: Not on file  . Emotionally Abused: Not on file  . Physically Abused: Not on file  . Sexually Abused: Not on file     PHYSICAL EXAM  GENERAL EXAM/CONSTITUTIONAL: Vitals:  Vitals:   06/19/20 1413  BP: (!) 174/93  Pulse: (!) 56  Weight: 135 lb (61.2 kg)  Height: 6' (1.829 m)     Body mass index is 18.31 kg/m. Wt Readings from Last 3 Encounters:  06/19/20 135 lb (61.2 kg)  09/29/18 137 lb 12.8 oz (62.5 kg)  09/04/18 140 lb 14 oz (63.9 kg)     Patient is in no distress; well  developed, nourished and groomed; neck is supple  CARDIOVASCULAR:  Examination of carotid arteries is normal; no carotid bruits  Regular rate and rhythm, no murmurs  Examination of peripheral vascular system by observation and palpation is normal  EYES:  Ophthalmoscopic exam of optic discs and posterior segments is normal; no  papilledema or hemorrhages  No exam data present  MUSCULOSKELETAL:  Gait, strength, tone, movements noted in Neurologic exam below  NEUROLOGIC: MENTAL STATUS:  No flowsheet data found.  awake, alert, oriented to person, place and time  recent and remote memory intact  normal attention and concentration  language fluent, comprehension intact, naming intact  fund of knowledge appropriate  CRANIAL NERVE:   2nd - no papilledema on fundoscopic exam  2nd, 3rd, 4th, 6th - pupils equal and reactive to light, visual fields full to confrontation, extraocular muscles intact, no nystagmus  5th - facial sensation symmetric  7th - facial strength symmetric  8th - hearing intact  9th - palate elevates symmetrically, uvula midline  11th - shoulder shrug symmetric  12th - tongue protrusion midline  MOTOR:   normal bulk and tone, full strength in the BUE, BLE; EXCEPT RIGHT HF 4  SENSORY:   normal and symmetric to light touch, temperature, vibration  COORDINATION:   finger-nose-finger, fine finger movements normal  REFLEXES:   deep tendon reflexes TRACE and symmetric  GAIT/STATION:   narrow based gait     DIAGNOSTIC DATA (LABS, IMAGING, TESTING) - I reviewed patient records, labs, notes, testing and imaging myself where available.  Lab Results  Component Value Date   WBC 9.6 09/05/2018   HGB 10.6 (L) 09/05/2018   HCT 31.4 (L) 09/05/2018   MCV 92.4 09/05/2018   PLT 160 09/05/2018      Component Value Date/Time   NA 134 (L) 09/05/2018 0455   K 3.7 09/05/2018 0455   CL 104 09/05/2018 0455   CO2 25 09/05/2018 0455   GLUCOSE  124 (H) 09/05/2018 0455   BUN 15 09/05/2018 0455   CREATININE 0.98 09/05/2018 0455   CALCIUM 8.6 (L) 09/05/2018 0455   PROT 7.4 09/02/2018 1347   ALBUMIN 4.0 09/02/2018 1347   AST 27 09/02/2018 1347   ALT 22 09/02/2018 1347   ALKPHOS 41 09/02/2018 1347   BILITOT 0.9 09/02/2018 1347   GFRNONAA >60 09/05/2018 0455   GFRAA >60 09/05/2018 0455   No results found for: CHOL, HDL, LDLCALC, LDLDIRECT, TRIG, CHOLHDL No results found for: HGBA1C No results found for: VITAMINB12 No results found for: TSH   04/02/20 CT head  1. No acute intracranial pathology.  2. Redemonstrated encephalomalacia and volume loss of the left temporal lobe with ex vacuo dilatation of the left lateral ventricle.  3. Small-vessel white matter disease.     ASSESSMENT AND PLAN  79 y.o. year old male here with:L  Dx:  1. Abnormal involuntary movement   2. Confusion     PLAN:  PARTIAL ONSET SEIZURE (with secondary generalization) on 04/02/20; could be related to prior hemorrhagic stroke in 2005  - start levetiracetam 500mg  twice a day   - check MRI brain, EEG  - According to [place] law, you can not drive unless you are seizure / syncope free for at least 6 months and under physician's care.   - Please maintain precautions. Do not participate in activities where a loss of awareness could harm you or someone else. No swimming alone, no tub bathing, no hot tubs, no driving, no operating motorized vehicles (cars, ATVs, motocycles, etc), lawnmowers, power tools or firearms. No standing at heights, such as rooftops, ladders or stairs. Avoid hot objects such as stoves, heaters, open fires. Wear a helmet when riding a bicycle, scooter, skateboard, etc. and avoid areas of traffic. Set your water heater to 120 degrees or less.   Orders Placed  This Encounter  Procedures  . MR BRAIN W WO CONTRAST  . EEG adult   Meds ordered this encounter  Medications  . levETIRAcetam (KEPPRA) 500 MG tablet    Sig: Take 1 tablet (500  mg total) by mouth 2 (two) times daily.    Dispense:  180 tablet    Refill:  4   Return in about 9 months (around 03/19/2021) for with NP (Amy Lomax).  I reviewed images, labs, notes, records myself. I summarized findings and reviewed with patient, for this high risk condition (SEIZURE) requiring high complexity decision making.    Penni Bombard, MD 06/20/1752, 0:10 PM Certified in Neurology, Neurophysiology and Neuroimaging  [facility] [address] [place], [place] [address] [phone]

## 2020-06-19 NOTE — Telephone Encounter (Signed)
health team/phcs equitable auth: spoke to [facility]  [place] Ref # [id number] order sent to GI. They will reach out to the patient to schedule.

## 2020-06-27 DIAGNOSIS — E7849 Other hyperlipidemia: Secondary | ICD-10-CM | POA: Diagnosis not present

## 2020-06-27 DIAGNOSIS — J449 Chronic obstructive pulmonary disease, unspecified: Secondary | ICD-10-CM | POA: Diagnosis not present

## 2020-06-27 DIAGNOSIS — I10 Essential (primary) hypertension: Secondary | ICD-10-CM | POA: Diagnosis not present

## 2020-06-28 ENCOUNTER — Other Ambulatory Visit: Payer: Self-pay

## 2020-06-28 ENCOUNTER — Ambulatory Visit
Admission: RE | Admit: 2020-06-28 | Discharge: 2020-06-28 | Disposition: A | Discharge status: Home / Self Care | Payer: PPO | Source: Ambulatory Visit | Attending: Diagnostic Neuroimaging | Admitting: Diagnostic Neuroimaging

## 2020-06-28 DIAGNOSIS — R259 Unspecified abnormal involuntary movements: Secondary | ICD-10-CM

## 2020-06-28 DIAGNOSIS — R410 Disorientation, unspecified: Secondary | ICD-10-CM | POA: Diagnosis not present

## 2020-06-28 MED ORDER — GADOBENATE DIMEGLUMINE 529 MG/ML IV SOLN
15.0000 mL | Freq: Once | INTRAVENOUS | Status: AC | PRN
  Administered 2020-06-28: 15 mL via INTRAVENOUS

## 2020-07-06 DIAGNOSIS — Z1339 Encounter for screening examination for other mental health and behavioral disorders: Secondary | ICD-10-CM | POA: Diagnosis not present

## 2020-07-06 DIAGNOSIS — E7800 Pure hypercholesterolemia, unspecified: Secondary | ICD-10-CM | POA: Diagnosis not present

## 2020-07-06 DIAGNOSIS — I10 Essential (primary) hypertension: Secondary | ICD-10-CM | POA: Diagnosis not present

## 2020-07-06 DIAGNOSIS — Z0000 Encounter for general adult medical examination without abnormal findings: Secondary | ICD-10-CM | POA: Diagnosis not present

## 2020-07-06 DIAGNOSIS — R5383 Other fatigue: Secondary | ICD-10-CM | POA: Diagnosis not present

## 2020-07-06 DIAGNOSIS — Z7189 Other specified counseling: Secondary | ICD-10-CM | POA: Diagnosis not present

## 2020-07-06 DIAGNOSIS — Z299 Encounter for prophylactic measures, unspecified: Secondary | ICD-10-CM | POA: Diagnosis not present

## 2020-07-06 DIAGNOSIS — Z79899 Other long term (current) drug therapy: Secondary | ICD-10-CM | POA: Diagnosis not present

## 2020-07-06 DIAGNOSIS — N400 Enlarged prostate without lower urinary tract symptoms: Secondary | ICD-10-CM | POA: Diagnosis not present

## 2020-07-06 DIAGNOSIS — Z125 Encounter for screening for malignant neoplasm of prostate: Secondary | ICD-10-CM | POA: Diagnosis not present

## 2020-07-06 DIAGNOSIS — Z1331 Encounter for screening for depression: Secondary | ICD-10-CM | POA: Diagnosis not present

## 2020-07-10 ENCOUNTER — Ambulatory Visit (INDEPENDENT_AMBULATORY_CARE_PROVIDER_SITE_OTHER): Payer: PPO | Admitting: Diagnostic Neuroimaging

## 2020-07-10 ENCOUNTER — Other Ambulatory Visit: Payer: Self-pay

## 2020-07-10 DIAGNOSIS — R410 Disorientation, unspecified: Secondary | ICD-10-CM

## 2020-07-10 DIAGNOSIS — R259 Unspecified abnormal involuntary movements: Secondary | ICD-10-CM

## 2020-07-12 ENCOUNTER — Encounter (INDEPENDENT_AMBULATORY_CARE_PROVIDER_SITE_OTHER): Payer: Self-pay | Admitting: Gastroenterology

## 2020-07-13 NOTE — Procedures (Signed)
   [facility]  EEG (ELECTROENCEPHALOGRAM) REPORT   STUDY DATE: 07/10/20 PATIENT NAME: Miguel Daniels DOB: [birth year] MRN: [id number]  ORDERING CLINICIAN: Andrey Spearman, MD   TECHNOLOGIST: Babs Bertin TECHNIQUE: Electroencephalogram was recorded utilizing standard 10-20 system of lead placement and reformatted into average and bipolar montages.  RECORDING TIME: 25 minutes  ACTIVATION: hyperventilation and photic stimulation  CLINICAL INFORMATION: 79 year old male with possible seizure.  FINDINGS: Posterior dominant background rhythms, which attenuate with eye opening, ranging 8-9 hertz and 15-20 microvolts. No focal, lateralizing, epileptiform activity or seizures are seen. Patient recorded in the awake and drowsy state. EKG channel shows regular rhythm of 60-65 beats per minute.   IMPRESSION:   Normal EEG in the awake and drowsy states.    INTERPRETING PHYSICIAN:  Penni Bombard, MD Certified in Neurology, Neurophysiology and Neuroimaging  [facility] [address] [place], [place] [address] [phone]

## 2020-07-17 ENCOUNTER — Encounter: Payer: Self-pay | Admitting: *Deleted

## 2020-07-17 DIAGNOSIS — H2513 Age-related nuclear cataract, bilateral: Secondary | ICD-10-CM | POA: Diagnosis not present

## 2020-07-17 DIAGNOSIS — H524 Presbyopia: Secondary | ICD-10-CM | POA: Diagnosis not present

## 2020-07-17 DIAGNOSIS — H40013 Open angle with borderline findings, low risk, bilateral: Secondary | ICD-10-CM | POA: Diagnosis not present

## 2020-07-17 DIAGNOSIS — H04123 Dry eye syndrome of bilateral lacrimal glands: Secondary | ICD-10-CM | POA: Diagnosis not present

## 2020-07-17 DIAGNOSIS — H25013 Cortical age-related cataract, bilateral: Secondary | ICD-10-CM | POA: Diagnosis not present

## 2020-07-27 ENCOUNTER — Other Ambulatory Visit (INDEPENDENT_AMBULATORY_CARE_PROVIDER_SITE_OTHER): Payer: Self-pay | Admitting: *Deleted

## 2020-07-27 ENCOUNTER — Encounter (INDEPENDENT_AMBULATORY_CARE_PROVIDER_SITE_OTHER): Payer: Self-pay | Admitting: *Deleted

## 2020-07-27 ENCOUNTER — Encounter (INDEPENDENT_AMBULATORY_CARE_PROVIDER_SITE_OTHER): Payer: Self-pay | Admitting: Gastroenterology

## 2020-07-27 ENCOUNTER — Other Ambulatory Visit: Payer: Self-pay

## 2020-07-27 ENCOUNTER — Telehealth (INDEPENDENT_AMBULATORY_CARE_PROVIDER_SITE_OTHER): Payer: Self-pay | Admitting: *Deleted

## 2020-07-27 ENCOUNTER — Ambulatory Visit (INDEPENDENT_AMBULATORY_CARE_PROVIDER_SITE_OTHER): Payer: PPO | Admitting: Gastroenterology

## 2020-07-27 VITALS — BP 156/77 | HR 73 | Temp 99.4°F | Ht 72.0 in | Wt 138.0 lb

## 2020-07-27 DIAGNOSIS — K625 Hemorrhage of anus and rectum: Secondary | ICD-10-CM | POA: Diagnosis not present

## 2020-07-27 DIAGNOSIS — R634 Abnormal weight loss: Secondary | ICD-10-CM | POA: Diagnosis not present

## 2020-07-27 MED ORDER — SUPREP BOWEL PREP KIT 17.5-3.13-1.6 GM/177ML PO SOLN: 1.0000 | Freq: Once | ORAL | 0 refills | Status: AC

## 2020-07-27 NOTE — Telephone Encounter (Signed)
Patient needs suprep 

## 2020-07-27 NOTE — Progress Notes (Signed)
Maylon Peppers, M.D. Gastroenterology & Hepatology [facility] [address] [place], [place] [address] Primary Care Physician: Glenda Chroman, MD [address] [place] [address]  Referring MD: PCP  I will communicate my assessment and recommendations to the referring MD via EMR. Note: Occasional unusual wording and randomly placed punctuation marks may result from the use of speech recognition technology to transcribe this document"  Chief Complaint: Weight loss and rectal bleeding  History of Present Illness: Miguel Daniels is a 79 y.o. 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  Patient reports that 16 years ago he had a hemorrhagic stroke that led to a prolonged hospitalization.  He had to undergo neurologic rehabilitation and during this time he lost close to 40 pounds (used to weigh 180 pounds) in a month.  He reports that he work very hard on recovering from his stroke and was able to gain strength in the right side of his body but he has been concerned as he has been presenting persistent weight loss since then which has been progressive throughout the years.  He reports that he is losing close to 2 pounds per year, in average he estimates that he has lost close to 15 pounds through the years.  He reports that he tries to eat as much as he used to eat in the past and has good appetite but he thinks that "food is going through without being absorbed adequately".  The patient only endorses that he has had intermittent episodes of rectal bleeding when he wipes for the last 2 years which she associates to growth in his anal area that was painful in the past.  However, he reports that the last month he has been taking  Metamucil as recommended by his PCP and the bleeding has improved, has not had any bleeding for the last 2 weeks.  He usually has 2 bowel movements every day which are soft in nature.  The patient denies having any nausea, vomiting, fever, chills,  melena, hematemesis, abdominal distention, abdominal pain, diarrhea, jaundice, pruritus.  Last EGD: Never Last Colonoscopy: Believes  10 years ago - normal  FHx: neg for any gastrointestinal/liver disease, no malignancies Social: neg smoking, alcohol or illicit drug use Surgical: non contributory  Past Medical History: Past Medical History:  Diagnosis Date  . Acid reflux   . Arthritis   . COPD (chronic obstructive pulmonary disease) ([place])   . Gout   . Hepatitis   . High blood pressure   . Hypercholesteremia   . Insomnia   . Migraines   . Neuropathy   . Osteoporosis   . Rheumatic heart disease    as a child  . Seizure ([place]) 2007  . Small bowel obstruction ([place])   . Stenosis of carotid artery   . Stroke (cerebrum) ([place]) 07/06/2004  . Thoracic compression fracture ([place])   . TIA (transient ischemic attack) 08/12/2015    Past Surgical History: Past Surgical History:  Procedure Laterality Date  . ENDARTERECTOMY Left 1108/2019    ENDARTERECTOMY CAROTID LEFT (Left Neck)  . ENDARTERECTOMY Left 09/04/2018   Procedure: ENDARTERECTOMY CAROTID LEFT;  Surgeon: Rosetta Posner, MD;  Location: [facility];  Service: Vascular;  Laterality: Left;  . PATCH ANGIOPLASTY  09/04/2018   Procedure: PATCH ANGIOPLASTY LEFT  CAROTID ATERY USING HEMASHIELD PLATINUM FINESSE PATCH;  Surgeon: Rosetta Posner, MD;  Location[facility];  Service: Vascular;;  . TONSILLECTOMY      Family History: Family History  Problem Relation Age of Onset  . Hypertension Father   . High blood pressure Sister     Social History: Social History   Tobacco Use  Smoking Status Former Smoker  . Quit date: 06/15/1979  . Years since quitting: 41.1  Smokeless Tobacco Never Used   Social  History   Substance and Sexual Activity  Alcohol Use No  . Alcohol/week: 0.0 standard drinks   Social History   Substance and Sexual Activity  Drug Use No    Allergies: Allergies  Allergen Reactions  . Latex Hives    Medications: Current Outpatient Medications  Medication Sig Dispense Refill  . aspirin 81 MG chewable tablet Chew 81 mg by mouth daily.     . baclofen (LIORESAL) 10 MG tablet Take 10 mg by mouth 3 (three) times daily.  3  . BREO ELLIPTA 100-25 MCG/INH AEPB Inhale 1 puff into the lungs daily.   6  . Calcium Carb-Cholecalciferol (CALCIUM-VITAMIN D3) 600-500 MG-UNIT CAPS Take 1 tablet by mouth daily.    . diphenoxylate-atropine (LOMOTIL) 2.5-0.025 MG tablet Take by mouth 4 (four) times daily as needed for diarrhea or loose stools.    Marland Kitchen FLUoxetine (PROZAC) 20 MG capsule Take 20 mg by mouth daily.     . folic acid (FOLVITE) 390 MCG tablet Take 800 mcg by mouth daily.    . hydrochlorothiazide (HYDRODIURIL) 25 MG tablet Take 25 mg by mouth daily.     Marland Kitchen lisinopril (PRINIVIL,ZESTRIL) 40 MG tablet Take 10 mg by mouth daily.     . Multiple Vitamins-Minerals (MULTIVITAMIN PO) Take 1 tablet by mouth daily.    . Omega-3 Fatty Acids (FISH OIL) 1000 MG CAPS Take 1,000 mg by mouth 2 (two) times daily.     . Probiotic Product (PROBIOTIC PO) Take 1 capsule by mouth daily.    . Red Yeast Rice 600 MG CAPS Take 600 mg by mouth 2 (two) times daily.     Manus Gunning BOWEL PREP KIT 17.5-3.13-1.6 GM/177ML SOLN Take 1 kit by mouth once for 1 dose. 354 mL 0  . tamsulosin (FLOMAX) 0.4 MG CAPS capsule Take 0.4 mg by mouth at bedtime.    . traZODone (DESYREL) 50 MG tablet Take 50 mg by mouth at bedtime.     . vitamin C (ASCORBIC ACID) 500 MG tablet Take 500 mg by mouth daily.      No current facility-administered medications for this visit.    Review of Systems: GENERAL: negative for malaise, night sweats HEENT: No changes in hearing or vision, no nose bleeds or other nasal problems. NECK:  Negative for lumps, goiter, pain and significant neck swelling RESPIRATORY: Negative for cough, wheezing CARDIOVASCULAR: Negative for chest pain, leg swelling, palpitations, orthopnea GI: SEE HPI MUSCULOSKELETAL: Negative for joint pain or swelling, back pain, and muscle pain. SKIN: Negative for lesions, rash PSYCH: Negative for sleep disturbance, mood disorder and recent psychosocial stressors. HEMATOLOGY Negative for prolonged bleeding, bruising easily, and swollen nodes. ENDOCRINE: Negative for cold or heat intolerance, polyuria, polydipsia and goiter. NEURO: negative for tremor, gait imbalance, syncope and seizures. The remainder of the review of systems is noncontributory.   Physical Exam: BP (!) 156/77   Pulse 73   Temp 99.4 F (37.4 C)   Resp 16   Ht 6' (1.829 m)   [facility]  138 lb (62.6 kg)   BMI 18.72 kg/m  GENERAL: The patient is AO x3, in no acute distress. HEENT: Head is normocephalic and atraumatic. EOMI are intact. Mouth is well hydrated and without lesions. NECK: Supple. No masses LUNGS: Clear to auscultation. No presence of rhonchi/wheezing/rales. Adequate chest expansion HEART: RRR, normal s1 and s2. ABDOMEN: Soft, nontender, no guarding, no peritoneal signs, and nondistended. BS +. No masses. EXTREMITIES: Without any cyanosis, clubbing, rash, lesions or edema. NEUROLOGIC: AOx3, no focal motor deficit. SKIN: no jaundice, no rashes   Imaging/Labs: as above  I personally reviewed and interpreted the available labs, imaging and endoscopic files.  Impression and Plan: Miguel Daniels is a 79 y.o. 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  The patient has presented progressive weight loss through the years despite the fact he has been having an adequate intake of food.  He has presented some  rectal bleeding which will need to be investigated (likely related to hemorrhoidal bleeding), but otherwise denies any other associated symptoms.  Given his age, we will need to proceed with an EGD and a colonoscopy to evaluate this further.  If the investigations are negative for any source of weight loss, we will proceed with a CT of the chest, abdomen and pelvis with IV contrast.  For now, I encouraged him to increase the intake of protein supplements to gain some weight. Patient understood and agreed.   - Schedule EGD and colonoscopy - Start taking protein supplements such as Boost or Ensure - Will consider chest/abdomen/pelvis CT with IV con based on endoscopic findings  All questions were answered.      Maylon Peppers, MD Gastroenterology and Hepatology [facility]

## 2020-07-27 NOTE — Progress Notes (Signed)
Patient seen by Dr Jenetta Downer. Note opened in error.

## 2020-07-27 NOTE — Patient Instructions (Addendum)
Schedule EGD and colonoscopy Start taking protein supplements such as Boost or Ensure

## 2020-07-27 NOTE — H&P (View-Only) (Signed)
Miguel Daniels, M.D. Gastroenterology & Hepatology [facility] [address] [place], [place] [address] Primary Care Physician: Glenda Chroman, MD [address] [place] [address]  Referring MD: PCP  I will communicate my assessment and recommendations to the referring MD via EMR. Note: Occasional unusual wording and randomly placed punctuation marks may result from the use of speech recognition technology to transcribe this document"  Chief Complaint: Weight loss and rectal bleeding  History of Present Illness: Miguel Daniels is a 79 y.o. 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  Patient reports that 16 years ago he had a hemorrhagic stroke that led to a prolonged hospitalization.  He had to undergo neurologic rehabilitation and during this time he lost close to 40 pounds (used to weigh 180 pounds) in a month.  He reports that he work very hard on recovering from his stroke and was able to gain strength in the right side of his body but he has been concerned as he has been presenting persistent weight loss since then which has been progressive throughout the years.  He reports that he is losing close to 2 pounds per year, in average he estimates that he has lost close to 15 pounds through the years.  He reports that he tries to eat as much as he used to eat in the past and has good appetite but he thinks that "food is going through without being absorbed adequately".  The patient only endorses that he has had intermittent episodes of rectal bleeding when he wipes for the last 2 years which she associates to growth in his anal area that was painful in the past.  However, he reports that the last month he has been taking  Metamucil as recommended by his PCP and the bleeding has improved, has not had any bleeding for the last 2 weeks.  He usually has 2 bowel movements every day which are soft in nature.  The patient denies having any nausea, vomiting, fever, chills,  melena, hematemesis, abdominal distention, abdominal pain, diarrhea, jaundice, pruritus.  Last EGD: Never Last Colonoscopy: Believes  10 years ago - normal  FHx: neg for any gastrointestinal/liver disease, no malignancies Social: neg smoking, alcohol or illicit drug use Surgical: non contributory  Past Medical History: Past Medical History:  Diagnosis Date  . Acid reflux   . Arthritis   . COPD (chronic obstructive pulmonary disease) ([place])   . Gout   . Hepatitis   . High blood pressure   . Hypercholesteremia   . Insomnia   . Migraines   . Neuropathy   . Osteoporosis   . Rheumatic heart disease    as a child  . Seizure ([place]) 2007  . Small bowel obstruction ([place])   . Stenosis of carotid artery   . Stroke (cerebrum) ([place]) 07/06/2004  . Thoracic compression fracture ([place])   . TIA (transient ischemic attack) 08/12/2015    Past Surgical History: Past Surgical History:  Procedure Laterality Date  . ENDARTERECTOMY Left 1108/2019    ENDARTERECTOMY CAROTID LEFT (Left Neck)  . ENDARTERECTOMY Left 09/04/2018   Procedure: ENDARTERECTOMY CAROTID LEFT;  Surgeon: Rosetta Posner, MD;  Location: [facility];  Service: Vascular;  Laterality: Left;  . PATCH ANGIOPLASTY  09/04/2018   Procedure: PATCH ANGIOPLASTY LEFT  CAROTID ATERY USING HEMASHIELD PLATINUM FINESSE PATCH;  Surgeon: Rosetta Posner, MD;  Location: [facility] OR;  Service: Vascular;;  . TONSILLECTOMY      Family History: Family History  Problem Relation Age of Onset  . Hypertension Father   . High blood pressure Sister     Social History: Social History   Tobacco Use  Smoking Status Former Smoker  . Quit date: 06/15/1979  . Years since quitting: 41.1  Smokeless Tobacco Never Used   Social  History   Substance and Sexual Activity  Alcohol Use No  . Alcohol/week: 0.0 standard drinks   Social History   Substance and Sexual Activity  Drug Use No    Allergies: Allergies  Allergen Reactions  . Latex Hives    Medications: Current Outpatient Medications  Medication Sig Dispense Refill  . aspirin 81 MG chewable tablet Chew 81 mg by mouth daily.     . baclofen (LIORESAL) 10 MG tablet Take 10 mg by mouth 3 (three) times daily.  3  . BREO ELLIPTA 100-25 MCG/INH AEPB Inhale 1 puff into the lungs daily.   6  . Calcium Carb-Cholecalciferol (CALCIUM-VITAMIN D3) 600-500 MG-UNIT CAPS Take 1 tablet by mouth daily.    . diphenoxylate-atropine (LOMOTIL) 2.5-0.025 MG tablet Take by mouth 4 (four) times daily as needed for diarrhea or loose stools.    Marland Kitchen FLUoxetine (PROZAC) 20 MG capsule Take 20 mg by mouth daily.     . folic acid (FOLVITE) 557 MCG tablet Take 800 mcg by mouth daily.    . hydrochlorothiazide (HYDRODIURIL) 25 MG tablet Take 25 mg by mouth daily.     Marland Kitchen lisinopril (PRINIVIL,ZESTRIL) 40 MG tablet Take 10 mg by mouth daily.     . Multiple Vitamins-Minerals (MULTIVITAMIN PO) Take 1 tablet by mouth daily.    . Omega-3 Fatty Acids (FISH OIL) 1000 MG CAPS Take 1,000 mg by mouth 2 (two) times daily.     . Probiotic Product (PROBIOTIC PO) Take 1 capsule by mouth daily.    . Red Yeast Rice 600 MG CAPS Take 600 mg by mouth 2 (two) times daily.     Manus Gunning BOWEL PREP KIT 17.5-3.13-1.6 GM/177ML SOLN Take 1 kit by mouth once for 1 dose. 354 mL 0  . tamsulosin (FLOMAX) 0.4 MG CAPS capsule Take 0.4 mg by mouth at bedtime.    . traZODone (DESYREL) 50 MG tablet Take 50 mg by mouth at bedtime.     . vitamin C (ASCORBIC ACID) 500 MG tablet Take 500 mg by mouth daily.      No current facility-administered medications for this visit.    Review of Systems: GENERAL: negative for malaise, night sweats HEENT: No changes in hearing or vision, no nose bleeds or other nasal problems. NECK:  Negative for lumps, goiter, pain and significant neck swelling RESPIRATORY: Negative for cough, wheezing CARDIOVASCULAR: Negative for chest pain, leg swelling, palpitations, orthopnea GI: SEE HPI MUSCULOSKELETAL: Negative for joint pain or swelling, back pain, and muscle pain. SKIN: Negative for lesions, rash PSYCH: Negative for sleep disturbance, mood disorder and recent psychosocial stressors. HEMATOLOGY Negative for prolonged bleeding, bruising easily, and swollen nodes. ENDOCRINE: Negative for cold or heat intolerance, polyuria, polydipsia and goiter. NEURO: negative for tremor, gait imbalance, syncope and seizures. The remainder of the review of systems is noncontributory.   Physical Exam: BP (!) 156/77   Pulse 73   Temp 99.4 F (37.4 C)   Resp 16   Ht 6' (1.829 m)   [facility]  138 lb (62.6 kg)   BMI 18.72 kg/m  GENERAL: The patient is AO x3, in no acute distress. HEENT: Head is normocephalic and atraumatic. EOMI are intact. Mouth is well hydrated and without lesions. NECK: Supple. No masses LUNGS: Clear to auscultation. No presence of rhonchi/wheezing/rales. Adequate chest expansion HEART: RRR, normal s1 and s2. ABDOMEN: Soft, nontender, no guarding, no peritoneal signs, and nondistended. BS +. No masses. EXTREMITIES: Without any cyanosis, clubbing, rash, lesions or edema. NEUROLOGIC: AOx3, no focal motor deficit. SKIN: no jaundice, no rashes   Imaging/Labs: as above  I personally reviewed and interpreted the available labs, imaging and endoscopic files.  Impression and Plan: ELSTON ALDAPE is a 79 y.o. 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  The patient has presented progressive weight loss through the years despite the fact he has been having an adequate intake of food.  He has presented some  rectal bleeding which will need to be investigated (likely related to hemorrhoidal bleeding), but otherwise denies any other associated symptoms.  Given his age, we will need to proceed with an EGD and a colonoscopy to evaluate this further.  If the investigations are negative for any source of weight loss, we will proceed with a CT of the chest, abdomen and pelvis with IV contrast.  For now, I encouraged him to increase the intake of protein supplements to gain some weight. Patient understood and agreed.   - Schedule EGD and colonoscopy - Start taking protein supplements such as Boost or Ensure - Will consider chest/abdomen/pelvis CT with IV con based on endoscopic findings  All questions were answered.      Miguel Peppers, MD Gastroenterology and Hepatology [facility]

## 2020-08-21 NOTE — Patient Instructions (Signed)
Miguel Daniels  08/21/2020     @PREFPERIOPPHARMACY @   Your procedure is scheduled on  08/25/2020.  [facility] to [facility] at  [facility]  Call this number if you have problems the morning of surgery:  [phone]   Remember:  Follow the diet and prep instructions given to you by the office.                       Take these medicines the morning of surgery with A SIP OF WATER  Baclofen, prozac, flomax.    Do not wear jewelry, make-up or nail polish.  Do not wear lotions, powders, or perfumes. Please wear deodorant and brush your teeth.  Do not shave 48 hours prior to surgery.  Men may shave face and neck.  Do not bring valuables to the hospital.  [facility] is not responsible for any belongings or valuables.  Contacts, dentures or bridgework may not be worn into surgery.  Leave your suitcase in the car.  After surgery it may be brought to your room.  For patients admitted to the hospital, discharge time will be determined by your treatment team.  Patients discharged the day of surgery will not be allowed to drive home.   Name and phone number of your driver:   family Special instructions:  DO NOT smoke the morning of your procedure.  Please read over the following fact sheets that you were given. Anesthesia Post-op Instructions and Care and Recovery After Surgery       Upper Endoscopy, Adult, Care After This sheet gives you information about how to care for yourself after your procedure. Your health care provider may also give you more specific instructions. If you have problems or questions, contact your health care provider. What can I expect after the procedure? After the procedure, it is common to have:  A sore throat.  Mild stomach pain or discomfort.  Bloating.  Nausea. Follow these instructions at home:   Follow instructions from your health care provider about what to eat or drink after your procedure.  Return to your normal activities as told  by your health care provider. Ask your health care provider what activities are safe for you.  Take over-the-counter and prescription medicines only as told by your health care provider.  Do not drive for 24 hours if you were given a sedative during your procedure.  Keep all follow-up visits as told by your health care provider. This is important. Contact a health care provider if you have:  A sore throat that lasts longer than one day.  Trouble swallowing. Get help right away if:  You vomit blood or your vomit looks like coffee grounds.  You have: ? A fever. ? Bloody, black, or tarry stools. ? A severe sore throat or you cannot swallow. ? Difficulty breathing. ? Severe pain in your chest or abdomen. Summary  After the procedure, it is common to have a sore throat, mild stomach discomfort, bloating, and nausea.  Do not drive for 24 hours if you were given a sedative during the procedure.  Follow instructions from your health care provider about what to eat or drink after your procedure.  Return to your normal activities as told by your health care provider. This information is not intended to replace advice given to you by your health care provider. Make sure you discuss any questions you have with your health care provider. Document  Revised: 04/07/2018 Document Reviewed: 03/16/2018 Elsevier Patient Education  [place].  Colonoscopy, Adult, Care After This sheet gives you information about how to care for yourself after your procedure. Your health care provider may also give you more specific instructions. If you have problems or questions, contact your health care provider. What can I expect after the procedure? After the procedure, it is common to have:  A small amount of blood in your stool for 24 hours after the procedure.  Some gas.  Mild cramping or bloating of your abdomen. Follow these instructions at home: Eating and drinking   Drink enough fluid to  keep your urine pale yellow.  Follow instructions from your health care provider about eating or drinking restrictions.  Resume your normal diet as instructed by your health care provider. Avoid heavy or fried foods that are hard to digest. Activity  Rest as told by your health care provider.  Avoid sitting for a long time without moving. Get up to take short walks every 1-2 hours. This is important to improve blood flow and breathing. Ask for help if you feel weak or unsteady.  Return to your normal activities as told by your health care provider. Ask your health care provider what activities are safe for you. Managing cramping and bloating   Try walking around when you have cramps or feel bloated.  Apply heat to your abdomen as told by your health care provider. Use the heat source that your health care provider recommends, such as a moist heat pack or a heating pad. ? Place a towel between your skin and the heat source. ? Leave the heat on for 20-30 minutes. ? Remove the heat if your skin turns bright red. This is especially important if you are unable to feel pain, heat, or cold. You may have a greater risk of getting burned. General instructions  For the first 24 hours after the procedure: ? Do not drive or use machinery. ? Do not sign important documents. ? Do not drink alcohol. ? Do your regular daily activities at a slower pace than normal. ? Eat soft foods that are easy to digest.  Take over-the-counter and prescription medicines only as told by your health care provider.  Keep all follow-up visits as told by your health care provider. This is important. Contact a health care provider if:  You have blood in your stool 2-3 days after the procedure. Get help right away if you have:  More than a small spotting of blood in your stool.  Large blood clots in your stool.  Swelling of your abdomen.  Nausea or vomiting.  A fever.  Increasing pain in your abdomen that  is not relieved with medicine. Summary  After the procedure, it is common to have a small amount of blood in your stool. You may also have mild cramping and bloating of your abdomen.  For the first 24 hours after the procedure, do not drive or use machinery, sign important documents, or drink alcohol.  Get help right away if you have a lot of blood in your stool, nausea or vomiting, a fever, or increased pain in your abdomen. This information is not intended to replace advice given to you by your health care provider. Make sure you discuss any questions you have with your health care provider. Document Revised: 05/10/2019 Document Reviewed: 05/10/2019 Elsevier Patient Education  [place] After These instructions provide you with information about caring for yourself  after your procedure. Your health care provider may also give you more specific instructions. Your treatment has been planned according to current medical practices, but problems sometimes occur. Call your health care provider if you have any problems or questions after your procedure. What can I expect after the procedure? After your procedure, you may:  Feel sleepy for several hours.  Feel clumsy and have poor balance for several hours.  Feel forgetful about what happened after the procedure.  Have poor judgment for several hours.  Feel nauseous or vomit.  Have a sore throat if you had a breathing tube during the procedure. Follow these instructions at home: For at least 24 hours after the procedure:      Have a responsible adult stay with you. It is important to have someone help care for you until you are awake and alert.  Rest as needed.  Do not: ? Participate in activities in which you could fall or become injured. ? Drive. ? Use heavy machinery. ? Drink alcohol. ? Take sleeping pills or medicines that cause drowsiness. ? Make important decisions or sign legal  documents. ? Take care of children on your own. Eating and drinking  Follow the diet that is recommended by your health care provider.  If you vomit, drink water, juice, or soup when you can drink without vomiting.  Make sure you have little or no nausea before eating solid foods. General instructions  Take over-the-counter and prescription medicines only as told by your health care provider.  If you have sleep apnea, surgery and certain medicines can increase your risk for breathing problems. Follow instructions from your health care provider about wearing your sleep device: ? Anytime you are sleeping, including during daytime naps. ? While taking prescription pain medicines, sleeping medicines, or medicines that make you drowsy.  If you smoke, do not smoke without supervision.  Keep all follow-up visits as told by your health care provider. This is important. Contact a health care provider if:  You keep feeling nauseous or you keep vomiting.  You feel light-headed.  You develop a rash.  You have a fever. Get help right away if:  You have trouble breathing. Summary  For several hours after your procedure, you may feel sleepy and have poor judgment.  Have a responsible adult stay with you for at least 24 hours or until you are awake and alert. This information is not intended to replace advice given to you by your health care provider. Make sure you discuss any questions you have with your health care provider. Document Revised: 01/12/2018 Document Reviewed: 02/04/2016 Elsevier Patient Education  [place].

## 2020-08-23 ENCOUNTER — Other Ambulatory Visit: Payer: Self-pay

## 2020-08-23 ENCOUNTER — Encounter (HOSPITAL_COMMUNITY)
Admission: RE | Admit: 2020-08-23 | Discharge: 2020-08-23 | Disposition: A | Discharge status: Home / Self Care | Payer: PPO | Source: Ambulatory Visit | Attending: Gastroenterology | Admitting: Gastroenterology

## 2020-08-23 ENCOUNTER — Other Ambulatory Visit (HOSPITAL_COMMUNITY)
Admission: RE | Admit: 2020-08-23 | Discharge: 2020-08-23 | Disposition: A | Discharge status: Home / Self Care | Payer: PPO | Source: Ambulatory Visit | Attending: Gastroenterology | Admitting: Gastroenterology

## 2020-08-23 ENCOUNTER — Encounter (HOSPITAL_COMMUNITY): Payer: Self-pay

## 2020-08-23 DIAGNOSIS — Z20822 Contact with and (suspected) exposure to covid-19: Secondary | ICD-10-CM | POA: Diagnosis not present

## 2020-08-23 DIAGNOSIS — Z01818 Encounter for other preprocedural examination: Secondary | ICD-10-CM | POA: Insufficient documentation

## 2020-08-23 LAB — BASIC METABOLIC PANEL
Anion gap: 8 (ref 5–15)
BUN: 13 mg/dL (ref 8–23)
CO2: 26 mmol/L (ref 22–32)
Calcium: 9.3 mg/dL (ref 8.9–10.3)
Chloride: 96 mmol/L — ABNORMAL LOW (ref 98–111)
Creatinine, Ser: 0.71 mg/dL (ref 0.61–1.24)
GFR, Estimated: >60 mL/min (ref >60)
Glucose, Bld: 87 mg/dL (ref 70–99)
Potassium: 3.9 mmol/L (ref 3.5–5.1)
Sodium: 130 mmol/L — ABNORMAL LOW (ref 135–145)

## 2020-08-23 LAB — CBC WITH DIFFERENTIAL/PLATELET
Abs Immature Granulocytes: 0.03 10*3/uL (ref 0.00–0.07)
Basophils Absolute: 0 10*3/uL (ref 0.0–0.1)
Basophils Relative: 1 %
Eosinophils Absolute: 0.1 10*3/uL (ref 0.0–0.5)
Eosinophils Relative: 1 %
HCT: 37.9 % — ABNORMAL LOW (ref 39.0–52.0)
Hemoglobin: 12.8 g/dL — ABNORMAL LOW (ref 13.0–17.0)
Immature Granulocytes: 0 %
Lymphocytes Relative: 12 %
Lymphs Abs: 1.1 10*3/uL (ref 0.7–4.0)
MCH: 31.4 pg (ref 26.0–34.0)
MCHC: 33.8 g/dL (ref 30.0–36.0)
MCV: 92.9 fL (ref 80.0–100.0)
Monocytes Absolute: 1 10*3/uL (ref 0.1–1.0)
Monocytes Relative: 12 %
Neutro Abs: 6.3 10*3/uL (ref 1.7–7.7)
Neutrophils Relative %: 74 %
Platelets: 231 10*3/uL (ref 150–400)
RBC: 4.08 MIL/uL — ABNORMAL LOW (ref 4.22–5.81)
RDW: 12.5 % (ref 11.5–15.5)
WBC: 8.6 10*3/uL (ref 4.0–10.5)
nRBC: 0 % (ref 0.0–0.2)

## 2020-08-23 LAB — SARS CORONAVIRUS 2 (TAT 6-24 HRS): SARS Coronavirus 2: NEGATIVE

## 2020-08-25 ENCOUNTER — Other Ambulatory Visit (INDEPENDENT_AMBULATORY_CARE_PROVIDER_SITE_OTHER): Payer: Self-pay | Admitting: Gastroenterology

## 2020-08-25 ENCOUNTER — Encounter (HOSPITAL_COMMUNITY)
Admission: RE | Disposition: A | Discharge status: Home / Self Care | Payer: Self-pay | Source: Home / Self Care | Attending: Gastroenterology

## 2020-08-25 ENCOUNTER — Ambulatory Visit (HOSPITAL_COMMUNITY): Payer: PPO | Admitting: Anesthesiology

## 2020-08-25 ENCOUNTER — Ambulatory Visit (HOSPITAL_COMMUNITY)
Admission: RE | Admit: 2020-08-25 | Discharge: 2020-08-25 | Disposition: A | Discharge status: Home / Self Care | Payer: PPO | Attending: Gastroenterology | Admitting: Gastroenterology

## 2020-08-25 DIAGNOSIS — D132 Benign neoplasm of duodenum: Secondary | ICD-10-CM | POA: Insufficient documentation

## 2020-08-25 DIAGNOSIS — K648 Other hemorrhoids: Secondary | ICD-10-CM | POA: Insufficient documentation

## 2020-08-25 DIAGNOSIS — R634 Abnormal weight loss: Secondary | ICD-10-CM

## 2020-08-25 DIAGNOSIS — K3189 Other diseases of stomach and duodenum: Secondary | ICD-10-CM | POA: Insufficient documentation

## 2020-08-25 DIAGNOSIS — K269 Duodenal ulcer, unspecified as acute or chronic, without hemorrhage or perforation: Secondary | ICD-10-CM | POA: Diagnosis not present

## 2020-08-25 DIAGNOSIS — Z681 Body mass index (BMI) 19 or less, adult: Secondary | ICD-10-CM | POA: Insufficient documentation

## 2020-08-25 DIAGNOSIS — D120 Benign neoplasm of cecum: Secondary | ICD-10-CM | POA: Insufficient documentation

## 2020-08-25 DIAGNOSIS — K5730 Diverticulosis of large intestine without perforation or abscess without bleeding: Secondary | ICD-10-CM | POA: Diagnosis not present

## 2020-08-25 DIAGNOSIS — E7849 Other hyperlipidemia: Secondary | ICD-10-CM | POA: Diagnosis not present

## 2020-08-25 DIAGNOSIS — J449 Chronic obstructive pulmonary disease, unspecified: Secondary | ICD-10-CM | POA: Diagnosis not present

## 2020-08-25 DIAGNOSIS — K625 Hemorrhage of anus and rectum: Secondary | ICD-10-CM | POA: Diagnosis not present

## 2020-08-25 DIAGNOSIS — K2950 Unspecified chronic gastritis without bleeding: Secondary | ICD-10-CM | POA: Insufficient documentation

## 2020-08-25 DIAGNOSIS — Z87891 Personal history of nicotine dependence: Secondary | ICD-10-CM | POA: Insufficient documentation

## 2020-08-25 DIAGNOSIS — K5520 Angiodysplasia of colon without hemorrhage: Secondary | ICD-10-CM | POA: Diagnosis not present

## 2020-08-25 DIAGNOSIS — I10 Essential (primary) hypertension: Secondary | ICD-10-CM | POA: Diagnosis not present

## 2020-08-25 DIAGNOSIS — K317 Polyp of stomach and duodenum: Secondary | ICD-10-CM | POA: Diagnosis not present

## 2020-08-25 DIAGNOSIS — I69351 Hemiplegia and hemiparesis following cerebral infarction affecting right dominant side: Secondary | ICD-10-CM | POA: Diagnosis not present

## 2020-08-25 DIAGNOSIS — K635 Polyp of colon: Secondary | ICD-10-CM | POA: Diagnosis not present

## 2020-08-25 HISTORY — PX: COLONOSCOPY WITH PROPOFOL: SHX5780

## 2020-08-25 HISTORY — PX: POLYPECTOMY: SHX5525

## 2020-08-25 HISTORY — PX: BIOPSY: SHX5522

## 2020-08-25 HISTORY — PX: ESOPHAGOGASTRODUODENOSCOPY (EGD) WITH PROPOFOL: SHX5813

## 2020-08-25 SURGERY — COLONOSCOPY WITH PROPOFOL
Anesthesia: General

## 2020-08-25 MED ORDER — LACTATED RINGERS IV SOLN: INTRAVENOUS | Status: DC

## 2020-08-25 MED ORDER — PROPOFOL 10 MG/ML IV BOLUS
INTRAVENOUS | Status: AC
  Filled 2020-08-25: qty 80

## 2020-08-25 MED ORDER — LACTATED RINGERS IV SOLN: INTRAVENOUS | Status: DC | PRN

## 2020-08-25 MED ORDER — PHENYLEPHRINE HCL (PRESSORS) 10 MG/ML IV SOLN
INTRAVENOUS | Status: DC | PRN
  Administered 2020-08-25 (×4): 100 ug via INTRAVENOUS

## 2020-08-25 MED ORDER — PROPOFOL 500 MG/50ML IV EMUL
INTRAVENOUS | Status: DC | PRN
  Administered 2020-08-25: 100 ug/kg/min via INTRAVENOUS

## 2020-08-25 MED ORDER — STERILE WATER FOR IRRIGATION IR SOLN
Status: DC | PRN
  Administered 2020-08-25: 200 mL
  Administered 2020-08-25: 100 mL

## 2020-08-25 NOTE — Anesthesia Preprocedure Evaluation (Signed)
Anesthesia Evaluation  Patient identified by MRN, date of birth, ID band Patient awake    Reviewed: Allergy & Precautions, NPO status , Patient's Chart, lab work & pertinent test results, reviewed documented beta blocker date and time   Airway Mallampati: II  TM Distance: >3 FB Neck ROM: Full    Dental no notable dental hx.    Pulmonary COPD,  COPD inhaler, former smoker,    Pulmonary exam normal breath sounds clear to auscultation       Cardiovascular hypertension, Normal cardiovascular exam Rhythm:Regular Rate:Normal     Neuro/Psych    GI/Hepatic (+) Hepatitis - (Pt. unsure of type)  Endo/Other  negative endocrine ROS  Renal/GU      Musculoskeletal  (+) Arthritis , Osteoarthritis,    Abdominal   Peds  Hematology   Anesthesia Other Findings   Reproductive/Obstetrics                             Anesthesia Physical Anesthesia Plan  ASA: III  Anesthesia Plan: General   Post-op Pain Management:    Induction: Intravenous  PONV Risk Score and Plan:   Airway Management Planned: Nasal Cannula  Additional Equipment:   Intra-op Plan:   Post-operative Plan:   Informed Consent: I have reviewed the patients History and Physical, chart, labs and discussed the procedure including the risks, benefits and alternatives for the proposed anesthesia with the patient or authorized representative who has indicated his/her understanding and acceptance.     Dental advisory given  Plan Discussed with: CRNA  Anesthesia Plan Comments:         Anesthesia Quick Evaluation

## 2020-08-25 NOTE — Discharge Instructions (Signed)
You are being discharged to home.  Resume your previous diet.  We are waiting for your pathology results. Your physician has indicated that a repeat colonoscopy is not recommended due to your current age (79 years or older) for screening purposes.  Schedule CT chest/abdomen/pelvis for weight loss evaluation.   ****OFFICE WILL CALL REGARDING THIS TEST, Message left with Voice mail Lacretia Nicks coordinator    Upper Endoscopy, Adult, Care After This sheet gives you information about how to care for yourself after your procedure. Your health care provider may also give you more specific instructions. If you have problems or questions, contact your health care provider. What can I expect after the procedure? After the procedure, it is common to have:  A sore throat.  Mild stomach pain or discomfort.  Bloating.  Nausea. Follow these instructions at home:   Follow instructions from your health care provider about what to eat or drink after your procedure.  Return to your normal activities as told by your health care provider. Ask your health care provider what activities are safe for you.  Take over-the-counter and prescription medicines only as told by your health care provider.  Do not drive for 24 hours if you were given a sedative during your procedure.  Keep all follow-up visits as told by your health care provider. This is important. Contact a health care provider if you have:  A sore throat that lasts longer than one day.  Trouble swallowing. Get help right away if:  You vomit blood or your vomit looks like coffee grounds.  You have: ? A fever. ? Bloody, black, or tarry stools. ? A severe sore throat or you cannot swallow. ? Difficulty breathing. ? Severe pain in your chest or abdomen. Summary  After the procedure, it is common to have a sore throat, mild stomach discomfort, bloating, and nausea.  Do not drive for 24 hours if you were given a sedative during the  procedure.  Follow instructions from your health care provider about what to eat or drink after your procedure.  Return to your normal activities as told by your health care provider. This information is not intended to replace advice given to you by your health care provider. Make sure you discuss any questions you have with your health care provider. Document Revised: 04/07/2018 Document Reviewed: 03/16/2018 Elsevier Patient Education  [place].     Colonoscopy, Adult, Care After This sheet gives you information about how to care for yourself after your procedure. Your doctor may also give you more specific instructions. If you have problems or questions, call your doctor. What can I expect after the procedure? After the procedure, it is common to have:  A small amount of blood in your poop (stool) for 24 hours.  Some gas.  Mild cramping or bloating in your belly (abdomen). Follow these instructions at home: Eating and drinking   Drink enough fluid to keep your pee (urine) pale yellow.  Follow instructions from your doctor about what you cannot eat or drink.  Return to your normal diet as told by your doctor. Avoid heavy or fried foods that are hard to digest. Activity  Rest as told by your doctor.  Do not sit for a long time without moving. Get up to take short walks every 1-2 hours. This is important. Ask for help if you feel weak or unsteady.  Return to your normal activities as told by your doctor. Ask your doctor what activities are safe for  you. To help cramping and bloating:   Try walking around.  Put heat on your belly as told by your doctor. Use the heat source that your doctor recommends, such as a moist heat pack or a heating pad. ? Put a towel between your skin and the heat source. ? Leave the heat on for 20-30 minutes. ? Remove the heat if your skin turns bright red. This is very important if you are unable to feel pain, heat, or cold. You may  have a greater risk of getting burned. General instructions  For the first 24 hours after the procedure: ? Do not drive or use machinery. ? Do not sign important documents. ? Do not drink alcohol. ? Do your daily activities more slowly than normal. ? Eat foods that are soft and easy to digest.  Take over-the-counter or prescription medicines only as told by your doctor.  Keep all follow-up visits as told by your doctor. This is important. Contact a doctor if:  You have blood in your poop 2-3 days after the procedure. Get help right away if:  You have more than a small amount of blood in your poop.  You see large clumps of tissue (blood clots) in your poop.  Your belly is swollen.  You feel like you may vomit (nauseous).  You vomit.  You have a fever.  You have belly pain that gets worse, and medicine does not help your pain. Summary  After the procedure, it is common to have a small amount of blood in your poop. You may also have mild cramping and bloating in your belly.  For the first 24 hours after the procedure, do not drive or use machinery, do not sign important documents, and do not drink alcohol.  Get help right away if you have a lot of blood in your poop, feel like you may vomit, have a fever, or have more belly pain. This information is not intended to replace advice given to you by your health care provider. Make sure you discuss any questions you have with your health care provider. Document Revised: 05/10/2019 Document Reviewed: 05/10/2019 Elsevier Patient Education  [place]. \    Colon Polyps  Polyps are tissue growths inside the body. Polyps can grow in many places, including the large intestine (colon). A polyp may be a round bump or a mushroom-shaped growth. You could have one polyp or several. Most colon polyps are noncancerous (benign). However, some colon polyps can become cancerous over time. Finding and removing the polyps early can  help prevent this. What are the causes? The exact cause of colon polyps is not known. What increases the risk? You are more likely to develop this condition if you:  Have a family history of colon cancer or colon polyps.  Are older than 12 or older than 79 if you are African American.  Have inflammatory bowel disease, such as ulcerative colitis or Crohn's disease.  Have certain hereditary conditions, such as: ? Familial adenomatous polyposis. ? Lynch syndrome. ? Turcot syndrome. ? Peutz-Jeghers syndrome.  Are overweight.  Smoke cigarettes.  Do not get enough exercise.  Drink too much alcohol.  Eat a diet that is high in fat and red meat and low in fiber.  Had childhood cancer that was treated with abdominal radiation. What are the signs or symptoms? Most polyps do not cause symptoms. If you have symptoms, they may include:  Blood coming from your rectum when having a bowel movement.  Blood  in your stool. The stool may look dark red or black.  Abdominal pain.  A change in bowel habits, such as constipation or diarrhea. How is this diagnosed? This condition is diagnosed with a colonoscopy. This is a procedure in which a lighted, flexible scope is inserted into the anus and then passed into the colon to examine the area. Polyps are sometimes found when a colonoscopy is done as part of routine cancer screening tests. How is this treated? Treatment for this condition involves removing any polyps that are found. Most polyps can be removed during a colonoscopy. Those polyps will then be tested for cancer. Additional treatment may be needed depending on the results of testing. Follow these instructions at home: Lifestyle  Maintain a healthy weight, or lose weight if recommended by your health care provider.  Exercise every day or as told by your health care provider.  Do not use any products that contain nicotine or tobacco, such as cigarettes and e-cigarettes. If you need  help quitting, ask your health care provider.  If you drink alcohol, limit how much you have: ? 0-1 drink a day for women. ? 0-2 drinks a day for men.  Be aware of how much alcohol is in your drink. In the [place] one drink equals one 12 oz bottle of beer (355 mL), one 5 oz glass of wine (148 mL), or one 1 oz shot of hard liquor (44 mL). Eating and drinking   Eat foods that are high in fiber, such as fruits, vegetables, and whole grains.  Eat foods that are high in calcium and vitamin D, such as milk, cheese, yogurt, eggs, liver, fish, and broccoli.  Limit foods that are high in fat, such as fried foods and desserts.  Limit the amount of red meat and processed meat you eat, such as hot dogs, sausage, bacon, and lunch meats. General instructions  Keep all follow-up visits as told by your health care provider. This is important. ? This includes having regularly scheduled colonoscopies. ? Talk to your health care provider about when you need a colonoscopy. Contact a health care provider if:  You have new or worsening bleeding during a bowel movement.  You have new or increased blood in your stool.  You have a change in bowel habits.  You lose weight for no known reason. Summary  Polyps are tissue growths inside the body. Polyps can grow in many places, including the colon.  Most colon polyps are noncancerous (benign), but some can become cancerous over time.  This condition is diagnosed with a colonoscopy.  Treatment for this condition involves removing any polyps that are found. Most polyps can be removed during a colonoscopy. This information is not intended to replace advice given to you by your health care provider. Make sure you discuss any questions you have with your health care provider. Document Revised: 01/29/2018 Document Reviewed: 01/29/2018 Elsevier Patient Education  [place].    Diverticulosis  Diverticulosis is a condition that develops when small  pouches (diverticula) form in the wall of the large intestine (colon). The colon is where water is absorbed and stool (feces) is formed. The pouches form when the inside layer of the colon pushes through weak spots in the outer layers of the colon. You may have a few pouches or many of them. The pouches usually do not cause problems unless they become inflamed or infected. When this happens, the condition is called diverticulitis. What are the causes? The cause of this  condition is not known. What increases the risk? The following factors may make you more likely to develop this condition:  Being older than age 106. Your risk for this condition increases with age. Diverticulosis is rare among people younger than age 15. By age 63, many people have it.  Eating a low-fiber diet.  Having frequent constipation.  Being overweight.  Not getting enough exercise.  Smoking.  Taking over-the-counter pain medicines, like aspirin and ibuprofen.  Having a family history of diverticulosis. What are the signs or symptoms? In most people, there are no symptoms of this condition. If you do have symptoms, they may include:  Bloating.  Cramps in the abdomen.  Constipation or diarrhea.  Pain in the lower left side of the abdomen. How is this diagnosed? Because diverticulosis usually has no symptoms, it is most often diagnosed during an exam for other colon problems. The condition may be diagnosed by:  Using a flexible scope to examine the colon (colonoscopy).  Taking an X-ray of the colon after dye has been put into the colon (barium enema).  Having a CT scan. How is this treated? You may not need treatment for this condition. Your health care provider may recommend treatment to prevent problems. You may need treatment if you have symptoms or if you previously had diverticulitis. Treatment may include:  Eating a high-fiber diet.  Taking a fiber supplement.  Taking a live bacteria supplement  (probiotic).  Taking medicine to relax your colon. Follow these instructions at home: Medicines  Take over-the-counter and prescription medicines only as told by your health care provider.  If told by your health care provider, take a fiber supplement or probiotic. Constipation prevention Your condition may cause constipation. To prevent or treat constipation, you may need to:  Drink enough fluid to keep your urine pale yellow.  Take over-the-counter or prescription medicines.  Eat foods that are high in fiber, such as beans, whole grains, and fresh fruits and vegetables.  Limit foods that are high in fat and processed sugars, such as fried or sweet foods.  General instructions  Try not to strain when you have a bowel movement.  Keep all follow-up visits as told by your health care provider. This is important. Contact a health care provider if you:  Have pain in your abdomen.  Have bloating.  Have cramps.  Have not had a bowel movement in 3 days. Get help right away if:  Your pain gets worse.  Your bloating becomes very bad.  You have a fever or chills, and your symptoms suddenly get worse.  You vomit.  You have bowel movements that are bloody or black.  You have bleeding from your rectum. Summary  Diverticulosis is a condition that develops when small pouches (diverticula) form in the wall of the large intestine (colon).  You may have a few pouches or many of them.  This condition is most often diagnosed during an exam for other colon problems.  Treatment may include increasing the fiber in your diet, taking supplements, or taking medicines. This information is not intended to replace advice given to you by your health care provider. Make sure you discuss any questions you have with your health care provider. Document Revised: 05/13/2019 Document Reviewed: 05/13/2019 Elsevier Patient Education  [place]  After These instructions provide you with information about caring for yourself after your procedure. Your health care provider may also give you more specific instructions. Your treatment  has been planned according to current medical practices, but problems sometimes occur. Call your health care provider if you have any problems or questions after your procedure. What can I expect after the procedure? After your procedure, you may:  Feel sleepy for several hours.  Feel clumsy and have poor balance for several hours.  Feel forgetful about what happened after the procedure.  Have poor judgment for several hours.  Feel nauseous or vomit.  Have a sore throat if you had a breathing tube during the procedure. Follow these instructions at home: For at least 24 hours after the procedure:      Have a responsible adult stay with you. It is important to have someone help care for you until you are awake and alert.  Rest as needed.  Do not: ? Participate in activities in which you could fall or become injured. ? Drive. ? Use heavy machinery. ? Drink alcohol. ? Take sleeping pills or medicines that cause drowsiness. ? Make important decisions or sign legal documents. ? Take care of children on your own. Eating and drinking  Follow the diet that is recommended by your health care provider.  If you vomit, drink water, juice, or soup when you can drink without vomiting.  Make sure you have little or no nausea before eating solid foods. General instructions  Take over-the-counter and prescription medicines only as told by your health care provider.  If you have sleep apnea, surgery and certain medicines can increase your risk for breathing problems. Follow instructions from your health care provider about wearing your sleep device: ? Anytime you are sleeping, including during daytime naps. ? While taking prescription pain medicines, sleeping medicines, or medicines that make you  drowsy.  If you smoke, do not smoke without supervision.  Keep all follow-up visits as told by your health care provider. This is important. Contact a health care provider if:  You keep feeling nauseous or you keep vomiting.  You feel light-headed.  You develop a rash.  You have a fever. Get help right away if:  You have trouble breathing. Summary  For several hours after your procedure, you may feel sleepy and have poor judgment.  Have a responsible adult stay with you for at least 24 hours or until you are awake and alert. This information is not intended to replace advice given to you by your health care provider. Make sure you discuss any questions you have with your health care provider. Document Revised: 01/12/2018 Document Reviewed: 02/04/2016 Elsevier Patient Education  [place].

## 2020-08-25 NOTE — Progress Notes (Signed)
Weight loss eval with chest /a/p imaging

## 2020-08-25 NOTE — Interval H&P Note (Signed)
History and Physical Interval Note:  08/25/2020 10:49 AM   79 y.o. 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    The patient reports having a 2 pound loss 30-year since he had his stroke, reports losing a total of 15 pounds since then.  He has been eating adequately and has not changed his eating pattern through the years but is still having weight loss.  Also reports having episodes of rectal bleeding when he wipes for the last 2 years without presence of abdominal pain , nausea, vomiting, fever, chills, hematochezia, melena, hematemesis, abdominal distention, a diarrhea, jaundice, pruritus.  BP 126/83   Pulse 83   Temp 97.9 F (36.6 C) (Oral)   SpO2 96%  GENERAL: The patient is AO x3, in no acute distress. Elder.  HEENT: Head is normocephalic and atraumatic. EOMI are intact. Mouth is well hydrated and without lesions. NECK: Supple. No masses LUNGS: Clear to auscultation. No presence of rhonchi/wheezing/rales. Adequate chest expansion HEART: RRR, normal s1 and s2. ABDOMEN: Soft, nontender, no guarding, no peritoneal signs, and nondistended. BS +. No masses. EXTREMITIES: Without any cyanosis, clubbing, rash, lesions or edema. NEUROLOGIC: AOx3, no focal motor deficit. SKIN: no jaundice, no rashes   Miguel Daniels  has presented today for surgery, with the diagnosis of weight loss.  The various methods of treatment have been discussed with the patient and family. After consideration of risks, benefits and other options for treatment, the patient has consented to  Procedure(s) with comments: COLONOSCOPY WITH PROPOFOL (N/A) - 1015 ESOPHAGOGASTRODUODENOSCOPY (EGD) WITH PROPOFOL (N/A) as a surgical intervention.  The patient's history has been reviewed, patient examined, no change in status,  stable for surgery.  I have reviewed the patient's chart and labs.  Questions were answered to the patient's satisfaction.     [name]

## 2020-08-25 NOTE — Op Note (Signed)
[facility] Patient Name: Miguel Daniels Procedure Date: 08/25/2020 11:13 AM MRN: [id number] Date of Birth: 24-Nov-1940 Attending MD: Maylon Peppers ,  CSN: [id number] Age: 79 Admit Type: Outpatient Procedure:                Colonoscopy Indications:              Weight loss Providers:                Maylon Peppers, Crystal Page, Nelma Rothman,                            Technician Referring MD:              Medicines:                Monitored Anesthesia Care Complications:            No immediate complications. Estimated Blood Loss:     Estimated blood loss: none. Procedure:                Pre-Anesthesia Assessment:                           - Prior to the procedure, a History and Physical                            was performed, and patient medications, allergies                            and sensitivities were reviewed. The patient's                            tolerance of previous anesthesia was reviewed.                           - The risks and benefits of the procedure and the                            sedation options and risks were discussed with the                            patient. All questions were answered and informed                            consent was obtained.                           - ASA Grade Assessment: II - A patient with mild                            systemic disease.                           After obtaining informed consent, the colonoscope                            was passed under direct vision. Throughout the  procedure, the patient's blood pressure, pulse, and                            oxygen saturations were monitored continuously. The                            [id number]-[id number] ([id number]) scope was introduced through                            the anus and advanced to the the cecum, identified                            by appendiceal orifice and ileocecal valve. The                            colonoscopy was performed  without difficulty. The                            patient tolerated the procedure well. The quality                            of the bowel preparation was adequate. Scope                            withdrawal time was 13 minutes. Scope In: 11:16:52 AM Scope Out: 11:42:41 AM Scope Withdrawal Time: 0 hours 14 minutes 33 seconds  Total Procedure Duration: 0 hours 25 minutes 49 seconds  Findings:      Hemorrhoids were found on perianal exam.      Two sessile polyps were found in the cecum. The polyps were 1 to 2 mm in       size. These polyps were removed with a cold biopsy forceps. Resection       and retrieval were complete.      A few small and large-mouthed diverticula were found in the sigmoid       colon.      A single medium-sized angiodysplastic lesion without bleeding was found       in the descending colon.      Non-bleeding internal hemorrhoids were found during retroflexion. The       hemorrhoids were small. Impression:               - Hemorrhoids found on perianal exam.                           - Two 1 to 2 mm polyps in the cecum, removed with a                            cold biopsy forceps. Resected and retrieved.                           - Diverticulosis in the sigmoid colon.                           - A single non-bleeding colonic angiodysplastic  lesion.                           - Non-bleeding internal hemorrhoids. Moderate Sedation:      Per Anesthesia Care Recommendation:           - Discharge patient to home (ambulatory).                           - High fiber diet.                           - Await pathology results.                           - Repeat colonoscopy is not recommended due to                            current age (54 years or older) for screening                            purposes.                           - Schedule CT chest/abdomen/pelvis for weight loss                            evaluation. Procedure Code(s):         --- Professional ---                           [phone], GC, Colonoscopy, flexible; with biopsy,                            single or multiple Diagnosis Code(s):        --- Professional ---                           K64.8, Other hemorrhoids                           K63.5, Polyp of colon                           K55.20, Angiodysplasia of colon without hemorrhage                           R63.4, Abnormal weight loss                           K57.30, Diverticulosis of large intestine without                            perforation or abscess without bleeding CPT copyright 2019 American Medical Association. All rights reserved. The codes documented in this report are preliminary and upon coder review may  be revised to meet current compliance requirements. Maylon Peppers, MD Maylon Peppers,  08/25/2020 11:57:30 AM This report has been signed electronically. Number of Addenda: 0

## 2020-08-25 NOTE — Anesthesia Postprocedure Evaluation (Signed)
Anesthesia Post Note  Patient: Miguel Daniels  Procedure(s) Performed: COLONOSCOPY WITH PROPOFOL (N/A ) ESOPHAGOGASTRODUODENOSCOPY (EGD) WITH PROPOFOL (N/A ) BIOPSY POLYPECTOMY  Patient location during evaluation: PACU Anesthesia Type: General Level of consciousness: awake, oriented, awake and alert and patient cooperative Pain management: satisfactory to patient Vital Signs Assessment: post-procedure vital signs reviewed and stable Respiratory status: spontaneous breathing, respiratory function stable, nonlabored ventilation and patient connected to nasal cannula oxygen Cardiovascular status: stable Postop Assessment: no apparent nausea or vomiting Anesthetic complications: no   No complications documented.   Last Vitals:  Vitals:   08/25/20 0917  BP: 126/83  Pulse: 83  Temp: 36.6 C  SpO2: 96%    Last Pain:  Vitals:   08/25/20 1052  TempSrc:   PainSc: 0-No pain                 Isacc Turney

## 2020-08-25 NOTE — Transfer of Care (Signed)
Immediate Anesthesia Transfer of Care Note  Patient: Miguel Daniels  Procedure(s) Performed: COLONOSCOPY WITH PROPOFOL (N/A ) ESOPHAGOGASTRODUODENOSCOPY (EGD) WITH PROPOFOL (N/A ) BIOPSY POLYPECTOMY  Patient Location: PACU  Anesthesia Type:General  Level of Consciousness: awake, alert , oriented and patient cooperative  Airway & Oxygen Therapy: Patient Spontanous Breathing and Patient connected to nasal cannula oxygen  Post-op Assessment: Report given to RN, Post -op Vital signs reviewed and stable and Patient moving all extremities X 4  Post vital signs: Reviewed and stable  Last Vitals:  Vitals Value Taken Time  BP    Temp    Pulse 66 08/25/20 1150  Resp 6 08/25/20 1150  SpO2 98 % 08/25/20 1150  Vitals shown include unvalidated device data.  Last Pain:  Vitals:   08/25/20 1052  TempSrc:   PainSc: 0-No pain      Patients Stated Pain Goal: 5 (61/44/31 5400)  Complications: No complications documented.

## 2020-08-25 NOTE — Op Note (Signed)
[facility] Patient Name: Miguel Daniels Procedure Date: 08/25/2020 10:45 AM MRN: [id number] Date of Birth: Mar 24, 1941 Attending MD: Maylon Peppers ,  CSN: [id number] Age: 79 Admit Type: Outpatient Procedure:                Upper GI endoscopy Indications:              Weight loss Providers:                Maylon Peppers, Crystal Page, Nelma Rothman,                            Technician Referring MD:              Medicines:                Monitored Anesthesia Care Complications:            No immediate complications. Estimated Blood Loss:     Estimated blood loss: none. Procedure:                Pre-Anesthesia Assessment:                           - Prior to the procedure, a History and Physical                            was performed, and patient medications, allergies                            and sensitivities were reviewed. The patient's                            tolerance of previous anesthesia was reviewed.                           - The risks and benefits of the procedure and the                            sedation options and risks were discussed with the                            patient. All questions were answered and informed                            consent was obtained.                           - ASA Grade Assessment: II - A patient with mild                            systemic disease.                           After obtaining informed consent, the endoscope was                            passed under direct vision. Throughout the  procedure, the patient's blood pressure, pulse, and                            oxygen saturations were monitored continuously. The                            [id number]-[id number] ([id number]) was introduced through the                            mouth, and advanced to the second part of duodenum.                            The upper GI endoscopy was accomplished without                            difficulty. The patient  tolerated the procedure                            well. Scope In: 10:57:45 AM Scope Out: 11:10:57 AM Total Procedure Duration: 0 hours 13 minutes 12 seconds  Findings:      The examined esophagus was normal.      The entire examined stomach was normal. Biopsies were taken with a cold       forceps for Helicobacter pylori testing.      A few localized erosions without bleeding were found in the duodenal       bulb. Biopsies from the second portion of the small bowel were taken       with a cold forceps for histology.      A single 6 mm semi-sessile polyp with no bleeding was found in the first       portion of the duodenum. The polyp was removed with a cold snare.       Resection and retrieval were complete. Impression:               - Normal esophagus.                           - Normal stomach. Biopsied.                           - Duodenal erosions without bleeding. Biopsied.                           - A single duodenal polyp. Resected and retrieved. Moderate Sedation:      Per Anesthesia Care Recommendation:           - Discharge patient to home (ambulatory).                           - Resume previous diet.                           - Await pathology results. Procedure Code(s):        --- Professional ---                           28786, GC, Esophagogastroduodenoscopy, flexible,  transoral; with removal of tumor(s), polyp(s), or                            other lesion(s) by snare technique                           43239, 59, Esophagogastroduodenoscopy, flexible,                            transoral; with biopsy, single or multiple Diagnosis Code(s):        --- Professional ---                           K26.9, Duodenal ulcer, unspecified as acute or                            chronic, without hemorrhage or perforation                           K31.7, Polyp of stomach and duodenum                           R63.4, Abnormal weight loss CPT copyright  2019 American Medical Association. All rights reserved. The codes documented in this report are preliminary and upon coder review may  be revised to meet current compliance requirements. Maylon Peppers, MD Maylon Peppers,  08/25/2020 11:50:49 AM This report has been signed electronically. Number of Addenda: 0

## 2020-08-28 ENCOUNTER — Other Ambulatory Visit: Payer: Self-pay

## 2020-08-28 LAB — SURGICAL PATHOLOGY

## 2020-08-29 ENCOUNTER — Telehealth (INDEPENDENT_AMBULATORY_CARE_PROVIDER_SITE_OTHER): Payer: Self-pay

## 2020-08-29 NOTE — Telephone Encounter (Signed)
Dr. Isac Sarna Nienhuis returned your call from yesterday

## 2020-08-29 NOTE — Telephone Encounter (Signed)
Call the patient back, his sister pick up the phone and said he was not available at the moment.  I asked her to give him the message to call back tomorrow.  His pathology showed some polyps in his colon and one duodenal adenoma but there is no need to repeat an EGD or colonoscopy.  He will need to schedule his CT of the abdomen and chest as previously discussed.  Maylon Peppers, MD Gastroenterology and Hepatology [facility]

## 2020-08-30 ENCOUNTER — Encounter (HOSPITAL_COMMUNITY): Payer: Self-pay | Admitting: Gastroenterology

## 2020-08-30 NOTE — Telephone Encounter (Signed)
Arvil Chaco and his sister are both aware of the results & recommendation

## 2020-09-07 DIAGNOSIS — I779 Disorder of arteries and arterioles, unspecified: Secondary | ICD-10-CM | POA: Diagnosis not present

## 2020-09-07 DIAGNOSIS — I10 Essential (primary) hypertension: Secondary | ICD-10-CM | POA: Diagnosis not present

## 2020-09-07 DIAGNOSIS — F322 Major depressive disorder, single episode, severe without psychotic features: Secondary | ICD-10-CM | POA: Diagnosis not present

## 2020-09-07 DIAGNOSIS — J449 Chronic obstructive pulmonary disease, unspecified: Secondary | ICD-10-CM | POA: Diagnosis not present

## 2020-09-07 DIAGNOSIS — Z299 Encounter for prophylactic measures, unspecified: Secondary | ICD-10-CM | POA: Diagnosis not present

## 2020-09-14 ENCOUNTER — Other Ambulatory Visit (INDEPENDENT_AMBULATORY_CARE_PROVIDER_SITE_OTHER): Payer: Self-pay | Admitting: Gastroenterology

## 2020-09-14 DIAGNOSIS — R634 Abnormal weight loss: Secondary | ICD-10-CM

## 2020-09-20 ENCOUNTER — Ambulatory Visit (HOSPITAL_COMMUNITY)
Admission: RE | Admit: 2020-09-20 | Discharge: 2020-09-20 | Disposition: A | Discharge status: Home / Self Care | Payer: PPO | Source: Ambulatory Visit | Attending: Gastroenterology | Admitting: Gastroenterology

## 2020-09-20 ENCOUNTER — Other Ambulatory Visit: Payer: Self-pay

## 2020-09-20 DIAGNOSIS — J439 Emphysema, unspecified: Secondary | ICD-10-CM | POA: Diagnosis not present

## 2020-09-20 DIAGNOSIS — I700 Atherosclerosis of aorta: Secondary | ICD-10-CM | POA: Diagnosis not present

## 2020-09-20 DIAGNOSIS — R634 Abnormal weight loss: Secondary | ICD-10-CM | POA: Insufficient documentation

## 2020-09-20 DIAGNOSIS — J984 Other disorders of lung: Secondary | ICD-10-CM | POA: Diagnosis not present

## 2020-09-20 DIAGNOSIS — K7689 Other specified diseases of liver: Secondary | ICD-10-CM | POA: Diagnosis not present

## 2020-09-20 DIAGNOSIS — I2510 Atherosclerotic heart disease of native coronary artery without angina pectoris: Secondary | ICD-10-CM | POA: Diagnosis not present

## 2020-09-20 DIAGNOSIS — K5730 Diverticulosis of large intestine without perforation or abscess without bleeding: Secondary | ICD-10-CM | POA: Insufficient documentation

## 2020-09-20 MED ORDER — IOHEXOL 300 MG/ML  SOLN
100.0000 mL | Freq: Once | INTRAMUSCULAR | Status: AC | PRN
  Administered 2020-09-20: 100 mL via INTRAVENOUS

## 2020-10-27 DIAGNOSIS — I10 Essential (primary) hypertension: Secondary | ICD-10-CM | POA: Diagnosis not present

## 2020-10-27 DIAGNOSIS — E7849 Other hyperlipidemia: Secondary | ICD-10-CM | POA: Diagnosis not present

## 2020-10-27 DIAGNOSIS — J449 Chronic obstructive pulmonary disease, unspecified: Secondary | ICD-10-CM | POA: Diagnosis not present

## 2020-11-27 DIAGNOSIS — J449 Chronic obstructive pulmonary disease, unspecified: Secondary | ICD-10-CM | POA: Diagnosis not present

## 2020-11-27 DIAGNOSIS — E7849 Other hyperlipidemia: Secondary | ICD-10-CM | POA: Diagnosis not present

## 2020-11-27 DIAGNOSIS — I10 Essential (primary) hypertension: Secondary | ICD-10-CM | POA: Diagnosis not present

## 2021-01-01 DIAGNOSIS — I10 Essential (primary) hypertension: Secondary | ICD-10-CM | POA: Diagnosis not present

## 2021-01-01 DIAGNOSIS — J449 Chronic obstructive pulmonary disease, unspecified: Secondary | ICD-10-CM | POA: Diagnosis not present

## 2021-01-01 DIAGNOSIS — L723 Sebaceous cyst: Secondary | ICD-10-CM | POA: Diagnosis not present

## 2021-01-01 DIAGNOSIS — I779 Disorder of arteries and arterioles, unspecified: Secondary | ICD-10-CM | POA: Diagnosis not present

## 2021-01-01 DIAGNOSIS — Z299 Encounter for prophylactic measures, unspecified: Secondary | ICD-10-CM | POA: Diagnosis not present

## 2021-01-24 DIAGNOSIS — J449 Chronic obstructive pulmonary disease, unspecified: Secondary | ICD-10-CM | POA: Diagnosis not present

## 2021-01-24 DIAGNOSIS — E7849 Other hyperlipidemia: Secondary | ICD-10-CM | POA: Diagnosis not present

## 2021-01-24 DIAGNOSIS — E785 Hyperlipidemia, unspecified: Secondary | ICD-10-CM | POA: Diagnosis not present

## 2021-03-19 ENCOUNTER — Ambulatory Visit: Payer: PPO | Admitting: Family Medicine

## 2021-03-26 DIAGNOSIS — I10 Essential (primary) hypertension: Secondary | ICD-10-CM | POA: Diagnosis not present

## 2021-03-26 DIAGNOSIS — E7849 Other hyperlipidemia: Secondary | ICD-10-CM | POA: Diagnosis not present

## 2021-03-26 DIAGNOSIS — E1165 Type 2 diabetes mellitus with hyperglycemia: Secondary | ICD-10-CM | POA: Diagnosis not present

## 2021-07-09 DIAGNOSIS — Z1339 Encounter for screening examination for other mental health and behavioral disorders: Secondary | ICD-10-CM | POA: Diagnosis not present

## 2021-07-09 DIAGNOSIS — R5383 Other fatigue: Secondary | ICD-10-CM | POA: Diagnosis not present

## 2021-07-09 DIAGNOSIS — Z7189 Other specified counseling: Secondary | ICD-10-CM | POA: Diagnosis not present

## 2021-07-09 DIAGNOSIS — E7800 Pure hypercholesterolemia, unspecified: Secondary | ICD-10-CM | POA: Diagnosis not present

## 2021-07-09 DIAGNOSIS — Z125 Encounter for screening for malignant neoplasm of prostate: Secondary | ICD-10-CM | POA: Diagnosis not present

## 2021-07-09 DIAGNOSIS — Z79899 Other long term (current) drug therapy: Secondary | ICD-10-CM | POA: Diagnosis not present

## 2021-07-09 DIAGNOSIS — I10 Essential (primary) hypertension: Secondary | ICD-10-CM | POA: Diagnosis not present

## 2021-07-09 DIAGNOSIS — Z299 Encounter for prophylactic measures, unspecified: Secondary | ICD-10-CM | POA: Diagnosis not present

## 2021-07-09 DIAGNOSIS — Z1331 Encounter for screening for depression: Secondary | ICD-10-CM | POA: Diagnosis not present

## 2021-07-09 DIAGNOSIS — Z0000 Encounter for general adult medical examination without abnormal findings: Secondary | ICD-10-CM | POA: Diagnosis not present

## 2021-07-09 DIAGNOSIS — Z681 Body mass index (BMI) 19 or less, adult: Secondary | ICD-10-CM | POA: Diagnosis not present

## 2021-07-23 DIAGNOSIS — H35363 Drusen (degenerative) of macula, bilateral: Secondary | ICD-10-CM | POA: Diagnosis not present

## 2021-07-23 DIAGNOSIS — H04123 Dry eye syndrome of bilateral lacrimal glands: Secondary | ICD-10-CM | POA: Diagnosis not present

## 2021-07-23 DIAGNOSIS — H524 Presbyopia: Secondary | ICD-10-CM | POA: Diagnosis not present

## 2021-07-23 DIAGNOSIS — H2513 Age-related nuclear cataract, bilateral: Secondary | ICD-10-CM | POA: Diagnosis not present

## 2021-07-23 DIAGNOSIS — H40013 Open angle with borderline findings, low risk, bilateral: Secondary | ICD-10-CM | POA: Diagnosis not present

## 2021-07-25 DIAGNOSIS — L820 Inflamed seborrheic keratosis: Secondary | ICD-10-CM | POA: Diagnosis not present

## 2021-07-25 DIAGNOSIS — D485 Neoplasm of uncertain behavior of skin: Secondary | ICD-10-CM | POA: Diagnosis not present

## 2021-07-25 DIAGNOSIS — Z299 Encounter for prophylactic measures, unspecified: Secondary | ICD-10-CM | POA: Diagnosis not present

## 2021-07-25 DIAGNOSIS — I10 Essential (primary) hypertension: Secondary | ICD-10-CM | POA: Diagnosis not present

## 2021-07-27 DIAGNOSIS — R197 Diarrhea, unspecified: Secondary | ICD-10-CM | POA: Diagnosis not present

## 2021-07-27 DIAGNOSIS — J209 Acute bronchitis, unspecified: Secondary | ICD-10-CM | POA: Diagnosis not present

## 2021-08-02 DIAGNOSIS — R972 Elevated prostate specific antigen [PSA]: Secondary | ICD-10-CM | POA: Diagnosis not present

## 2021-08-27 DIAGNOSIS — R197 Diarrhea, unspecified: Secondary | ICD-10-CM | POA: Diagnosis not present

## 2021-08-27 DIAGNOSIS — J209 Acute bronchitis, unspecified: Secondary | ICD-10-CM | POA: Diagnosis not present

## 2021-12-03 DIAGNOSIS — I10 Essential (primary) hypertension: Secondary | ICD-10-CM | POA: Diagnosis not present

## 2021-12-03 DIAGNOSIS — Z299 Encounter for prophylactic measures, unspecified: Secondary | ICD-10-CM | POA: Diagnosis not present

## 2021-12-03 DIAGNOSIS — J449 Chronic obstructive pulmonary disease, unspecified: Secondary | ICD-10-CM | POA: Diagnosis not present

## 2021-12-03 DIAGNOSIS — R972 Elevated prostate specific antigen [PSA]: Secondary | ICD-10-CM | POA: Diagnosis not present

## 2021-12-03 DIAGNOSIS — I700 Atherosclerosis of aorta: Secondary | ICD-10-CM | POA: Diagnosis not present

## 2021-12-03 DIAGNOSIS — Z681 Body mass index (BMI) 19 or less, adult: Secondary | ICD-10-CM | POA: Diagnosis not present

## 2021-12-19 ENCOUNTER — Encounter: Payer: Self-pay | Admitting: Urology

## 2021-12-19 ENCOUNTER — Other Ambulatory Visit: Payer: Self-pay

## 2021-12-19 ENCOUNTER — Ambulatory Visit: Payer: PPO | Admitting: Urology

## 2021-12-19 ENCOUNTER — Encounter: Payer: Self-pay | Admitting: *Deleted

## 2021-12-19 VITALS — BP 158/88 | HR 72 | Wt 138.0 lb

## 2021-12-19 DIAGNOSIS — N138 Other obstructive and reflux uropathy: Secondary | ICD-10-CM | POA: Diagnosis not present

## 2021-12-19 DIAGNOSIS — R972 Elevated prostate specific antigen [PSA]: Secondary | ICD-10-CM

## 2021-12-19 DIAGNOSIS — N401 Enlarged prostate with lower urinary tract symptoms: Secondary | ICD-10-CM

## 2021-12-19 LAB — URINALYSIS, ROUTINE W REFLEX MICROSCOPIC
Bilirubin, UA: NEGATIVE
Glucose, UA: NEGATIVE
Ketones, UA: NEGATIVE
Leukocytes,UA: NEGATIVE
Nitrite, UA: NEGATIVE
Protein,UA: NEGATIVE
Specific Gravity, UA: 1.02 (ref 1.005–1.030)
Urobilinogen, Ur: 0.2 mg/dL (ref 0.2–1.0)
pH, UA: 7 (ref 5.0–7.5)

## 2021-12-19 LAB — MICROSCOPIC EXAMINATION
Bacteria, UA: NONE SEEN
Epithelial Cells (non renal): NONE SEEN /hpf (ref 0–10)
RBC, Urine: NONE SEEN /hpf (ref 0–2)
Renal Epithel, UA: NONE SEEN /hpf
WBC, UA: NONE SEEN /hpf (ref 0–5)

## 2021-12-19 LAB — BLADDER SCAN AMB NON-IMAGING: Scan Result: 4

## 2021-12-19 NOTE — Progress Notes (Signed)
Assessment: 1. Elevated PSA   2. BPH with obstruction/lower urinary tract symptoms     Plan: I reviewed the patient's records from Dr. Woody Seller including recent office notes and recent PSA result.  Unfortunately, no prior PSA results were provided for review. Request all prior PSA results from Dr. Woody Seller I had a lengthy discussion with the patient and his wife regarding current recommendations for PSA testing.  Possible causes of PSA elevation discussed including prostate cancer, BPH, and prostatitis/UTI.  We discussed the natural history of PSA with some increase in PSA expected with age.  The role of prostate biopsy in further evaluation of elevated PSA discussed.  I advised him that this is typically done to make a diagnosis of prostate cancer and to guide treatment decisions.  Typically, this is recommended in men that have a >10-year life expectancy.  At this time, I do not feel that further evaluation with a prostate biopsy is indicated.  Even if prostate cancer was diagnosed, I do not think he would benefit from aggressive therapy given his age and other medical conditions.  He and his wife are in agreement with this plan.   Recommend follow-up in 6 months with PSA.  Chief Complaint:  Chief Complaint  Patient presents with   Elevated PSA    History of Present Illness:  Miguel Daniels is a 81 y.o. year old male who is seen in consultation from Glenda Chroman, MD for evaluation of elevated PSA. Recent PSA from 2/23 was 8.3.  He reports that his PSA was in the 5-6 range approximately 3-4 months ago. Prior documented PSA results: 8/16 0.9  He has a history of BPH which has been managed with tamsulosin.  He has been on the tamsulosin for several years.  He is not having any significant lower urinary tract symptoms at the present time.  He does have occasional nocturia 2-3 times.  No dysuria or gross hematuria.  No history of UTIs or prostatitis.  No family history of prostate cancer.  No prior  prostate biopsy.  IPSS = 4 today.  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   Thoracic compression fracture ([place])    TIA (transient ischemic attack) 08/12/2015    Past Surgical History:  Past Surgical History:  Procedure Laterality Date   BIOPSY  08/25/2020   Procedure: BIOPSY;  Surgeon: Harvel Quale, MD;  [facility];  Service: Gastroenterology;;   COLONOSCOPY WITH PROPOFOL N/A 08/25/2020   Procedure: COLONOSCOPY WITH PROPOFOL;  Surgeon: Harvel Quale, MD;  [facility];  Service: Gastroenterology;  Laterality: N/A;  1015   ENDARTERECTOMY Left 1108/2019    ENDARTERECTOMY CAROTID LEFT (Left Neck)   ENDARTERECTOMY Left 09/04/2018   Procedure: ENDARTERECTOMY CAROTID LEFT;  Surgeon: Rosetta Posner, MD;  Location: [facility];  Service: Vascular;  Laterality: Left;   ESOPHAGOGASTRODUODENOSCOPY (EGD) WITH PROPOFOL N/A 08/25/2020  Procedure: ESOPHAGOGASTRODUODENOSCOPY (EGD) WITH PROPOFOL;  Surgeon: Harvel Quale, MD;  [facility];  Service: Gastroenterology;  Laterality: N/A;   PATCH ANGIOPLASTY  09/04/2018   Procedure: PATCH ANGIOPLASTY LEFT CAROTID ATERY USING HEMASHIELD PLATINUM FINESSE PATCH;  Surgeon: Rosetta Posner, MD;  Location: [facility];  Service: Vascular;;   POLYPECTOMY  08/25/2020   Procedure: POLYPECTOMY;  Surgeon: Harvel Quale, MD;  [facility];  Service: Gastroenterology;;   TONSILLECTOMY      Allergies:  Allergies  Allergen Reactions   Latex Hives     Family History:  Family History  Problem Relation Age of Onset   Hypertension Father    High blood pressure Sister     Social History:  Social History   Tobacco Use   Smoking status: Former    Packs/day: 1.00    Years: 20.00    Pack years: 20.00    Types: Cigarettes    Quit date: 06/15/1979    Years since quitting: 42.5   Smokeless tobacco: Never  Vaping Use   Vaping Use: Never used  Substance Use Topics   Alcohol use: No    Alcohol/week: 0.0 standard drinks   Drug use: No    Review of symptoms:  Constitutional:  Negative for unexplained weight loss, night sweats, fever, chills ENT:  Negative for nose bleeds, sinus pain, painful swallowing CV:  Negative for chest pain, shortness of breath, exercise intolerance, palpitations, loss of consciousness Resp:  Negative for cough, wheezing, shortness of breath GI:  Negative for nausea, vomiting, diarrhea, bloody stools GU:  Positives noted in HPI; otherwise negative for gross hematuria, dysuria, urinary incontinence Neuro:  Negative for seizures, poor balance, limb weakness, slurred speech Psych:  Negative for lack of energy, depression, anxiety Endocrine:  Negative for polydipsia, polyuria, symptoms of hypoglycemia (dizziness, hunger, sweating) Hematologic:  Negative for anemia, purpura, petechia, prolonged or excessive bleeding, use of anticoagulants  Allergic:  Negative for difficulty breathing or choking as a result of exposure to anything; no shellfish allergy; no allergic response (rash/itch) to materials, foods  Physical exam: BP (!) 158/88    Pulse 72    Wt 138 lb (62.6 kg)    BMI 17.72 kg/m  GENERAL APPEARANCE:  Well appearing, well developed, well nourished, NAD HEENT: Atraumatic, Normocephalic, oropharynx clear. NECK: Supple without lymphadenopathy or thyromegaly. LUNGS: Clear to auscultation bilaterally. HEART: Regular Rate and Rhythm without murmurs, gallops, or rubs. ABDOMEN: Soft, non-tender, No  Masses. EXTREMITIES: Moves all extremities well.  Without clubbing, cyanosis, or edema. NEUROLOGIC:  Alert and oriented x 3, normal gait, CN II-XII grossly intact.  MENTAL STATUS:  Appropriate. BACK:  Non-tender to palpation.  No CVAT SKIN:  Warm, dry and intact.   GU: Penis:  uncircumcised Meatus: Normal Scrotum: normal, no masses Testis: normal without masses bilateral Epididymis: normal Prostate: 40 g, NT, no nodules Rectum: Normal tone,  no masses or tenderness   Results: U/A dipstick negative  Results for orders placed or performed in visit on 12/19/21 (from the past 24 hour(s))  BLADDER SCAN AMB NON-IMAGING   Collection Time: 12/19/21 10:15 AM  Result Value Ref Range   Scan Result 4 ml

## 2021-12-19 NOTE — Progress Notes (Signed)
post void residual =4ml  

## 2022-02-07 DIAGNOSIS — Z789 Other specified health status: Secondary | ICD-10-CM | POA: Diagnosis not present

## 2022-02-07 DIAGNOSIS — I10 Essential (primary) hypertension: Secondary | ICD-10-CM | POA: Diagnosis not present

## 2022-02-07 DIAGNOSIS — Z299 Encounter for prophylactic measures, unspecified: Secondary | ICD-10-CM | POA: Diagnosis not present

## 2022-02-07 DIAGNOSIS — R0989 Other specified symptoms and signs involving the circulatory and respiratory systems: Secondary | ICD-10-CM | POA: Diagnosis not present

## 2022-02-11 DIAGNOSIS — I63039 Cerebral infarction due to thrombosis of unspecified carotid artery: Secondary | ICD-10-CM | POA: Diagnosis not present

## 2022-02-11 DIAGNOSIS — I6523 Occlusion and stenosis of bilateral carotid arteries: Secondary | ICD-10-CM | POA: Diagnosis not present

## 2022-03-07 DIAGNOSIS — I10 Essential (primary) hypertension: Secondary | ICD-10-CM | POA: Diagnosis not present

## 2022-03-07 DIAGNOSIS — G459 Transient cerebral ischemic attack, unspecified: Secondary | ICD-10-CM | POA: Diagnosis not present

## 2022-03-07 DIAGNOSIS — Z299 Encounter for prophylactic measures, unspecified: Secondary | ICD-10-CM | POA: Diagnosis not present

## 2022-03-07 DIAGNOSIS — R270 Ataxia, unspecified: Secondary | ICD-10-CM | POA: Diagnosis not present

## 2022-03-07 DIAGNOSIS — Z681 Body mass index (BMI) 19 or less, adult: Secondary | ICD-10-CM | POA: Diagnosis not present

## 2022-03-07 DIAGNOSIS — R410 Disorientation, unspecified: Secondary | ICD-10-CM | POA: Diagnosis not present

## 2022-03-27 DIAGNOSIS — E1165 Type 2 diabetes mellitus with hyperglycemia: Secondary | ICD-10-CM | POA: Diagnosis not present

## 2022-03-27 DIAGNOSIS — I10 Essential (primary) hypertension: Secondary | ICD-10-CM | POA: Diagnosis not present

## 2022-03-27 DIAGNOSIS — E7849 Other hyperlipidemia: Secondary | ICD-10-CM | POA: Diagnosis not present

## 2022-04-01 DIAGNOSIS — Z299 Encounter for prophylactic measures, unspecified: Secondary | ICD-10-CM | POA: Diagnosis not present

## 2022-04-01 DIAGNOSIS — Z713 Dietary counseling and surveillance: Secondary | ICD-10-CM | POA: Diagnosis not present

## 2022-04-01 DIAGNOSIS — I10 Essential (primary) hypertension: Secondary | ICD-10-CM | POA: Diagnosis not present

## 2022-04-01 DIAGNOSIS — Z681 Body mass index (BMI) 19 or less, adult: Secondary | ICD-10-CM | POA: Diagnosis not present

## 2022-06-10 ENCOUNTER — Other Ambulatory Visit: Payer: PPO

## 2022-06-10 DIAGNOSIS — R972 Elevated prostate specific antigen [PSA]: Secondary | ICD-10-CM

## 2022-06-10 DIAGNOSIS — N138 Other obstructive and reflux uropathy: Secondary | ICD-10-CM | POA: Diagnosis not present

## 2022-06-10 DIAGNOSIS — N401 Enlarged prostate with lower urinary tract symptoms: Secondary | ICD-10-CM | POA: Diagnosis not present

## 2022-06-11 ENCOUNTER — Other Ambulatory Visit: Payer: PPO

## 2022-06-11 LAB — PSA: Prostate Specific Ag, Serum: 8.8 ng/mL — ABNORMAL HIGH (ref 0.0–4.0)

## 2022-06-18 ENCOUNTER — Encounter: Payer: Self-pay | Admitting: Urology

## 2022-06-18 ENCOUNTER — Ambulatory Visit: Payer: PPO | Admitting: Urology

## 2022-06-18 VITALS — BP 161/78 | HR 62 | Ht 74.0 in | Wt 127.4 lb

## 2022-06-18 DIAGNOSIS — N401 Enlarged prostate with lower urinary tract symptoms: Secondary | ICD-10-CM

## 2022-06-18 DIAGNOSIS — R972 Elevated prostate specific antigen [PSA]: Secondary | ICD-10-CM | POA: Diagnosis not present

## 2022-06-18 DIAGNOSIS — N138 Other obstructive and reflux uropathy: Secondary | ICD-10-CM

## 2022-06-18 NOTE — Progress Notes (Signed)
Assessment: 1. Elevated PSA   2. BPH with obstruction/lower urinary tract symptoms     Plan: I again had a discussion with the patient and his wife regarding current recommendations for PSA testing.  Possible causes of PSA elevation discussed including prostate cancer, BPH, and prostatitis/UTI.  We discussed the natural history of PSA with some increase in PSA expected with age.  The role of prostate biopsy in further evaluation of elevated PSA discussed.  I advised him that this is typically done to make a diagnosis of prostate cancer and to guide treatment decisions.  Typically, this is recommended in men that have a >10-year life expectancy.   He is not interested in any further evaluation at this time which I think is reasonable. Return to office as needed.  Chief Complaint:  Chief Complaint  Patient presents with   Elevated PSA    History of Present Illness:  Miguel Daniels is a 81 y.o. year old male who is seen for further evaluation of elevated PSA. His PSA from 2/23 was 8.3.  He reports that his PSA was in the 5-6 range approximately 3-4 months ago. Prior documented PSA results: 8/16 0.9 8/17 1.0 8/18 1.3 8/19 1.1 9/20 1.4 9/21 2.4 9/22 6.6 10/22 5.7 11.2% free 2/23 8.3 8/23 8.8  He has a history of BPH which has been managed with tamsulosin.  He has been on the tamsulosin for several years.  He is not having any significant lower urinary tract symptoms at the present time.  He does have occasional nocturia 2-3 times.  No dysuria or gross hematuria.  No history of UTIs or prostatitis.  No family history of prostate cancer.  No prior prostate biopsy.  He returns today for follow-up.  No new urinary symptoms.  No dysuria or gross hematuria. IPSS = 3 today.  Portions of the above documentation were copied from a prior visit for review purposes only.   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   Thoracic compression fracture ([place])    TIA (transient ischemic attack) 08/12/2015    Past Surgical History:  Past Surgical History:  Procedure Laterality Date   BIOPSY  08/25/2020   Procedure: BIOPSY;  Surgeon: Harvel Quale, MD;  [facility];  Service: Gastroenterology;;   COLONOSCOPY WITH PROPOFOL N/A 08/25/2020   Procedure: COLONOSCOPY WITH PROPOFOL;  Surgeon: Harvel Quale, MD;  [facility];  Service: Gastroenterology;  Laterality: N/A;  1015   ENDARTERECTOMY Left 1108/2019    ENDARTERECTOMY CAROTID LEFT (Left Neck)   ENDARTERECTOMY Left 09/04/2018   Procedure: ENDARTERECTOMY CAROTID LEFT;  Surgeon: Rosetta Posner, MD;  Location: [facility];  Service: Vascular;  Laterality: Left;   ESOPHAGOGASTRODUODENOSCOPY (EGD) WITH PROPOFOL N/A 08/25/2020   Procedure: ESOPHAGOGASTRODUODENOSCOPY (EGD) WITH PROPOFOL;  Surgeon: Harvel Quale, MD;  [facility];  Service: Gastroenterology;  Laterality: N/A;   PATCH  ANGIOPLASTY  09/04/2018   Procedure: PATCH ANGIOPLASTY LEFT CAROTID ATERY USING HEMASHIELD PLATINUM FINESSE PATCH;  Surgeon: Rosetta Posner, MD;  Location: [facility];  Service: Vascular;;   POLYPECTOMY  08/25/2020   Procedure: POLYPECTOMY;  Surgeon: Harvel Quale, MD;  [facility];  Service: Gastroenterology;;   TONSILLECTOMY      Allergies:  Allergies  Allergen Reactions   Latex Hives    Family History:  Family History  Problem Relation Age of Onset   Hypertension Father    High blood pressure Sister     Social History:   Social History   Tobacco Use   Smoking status: Former    Packs/day: 1.00    Years: 20.00    Total pack years: 20.00    Types: Cigarettes    Quit date: 06/15/1979    Years since quitting: 43.0   Smokeless tobacco: Never  Vaping Use   Vaping Use: Never used  Substance Use Topics   Alcohol use: No    Alcohol/week: 0.0 standard drinks of alcohol   Drug use: No    ROS: Constitutional:  Negative for fever, chills, weight loss CV: Negative for chest pain, previous MI, hypertension Respiratory:  Negative for shortness of breath, wheezing, sleep apnea, frequent cough GI:  Negative for nausea, vomiting, bloody stool, GERD  Physical exam: BP (!) 161/78   Pulse 62   Ht '6\' 2"'$  (1.88 m)   Wt 127 lb 6.4 oz (57.8 kg)   BMI 16.36 kg/m  GENERAL APPEARANCE:  Well appearing, well developed, well nourished, NAD HEENT:  Atraumatic, normocephalic, oropharynx clear NECK:  Supple without lymphadenopathy or thyromegaly ABDOMEN:  Soft, non-tender, no masses EXTREMITIES:  Moves all extremities well, without clubbing, cyanosis, or edema NEUROLOGIC:  Alert and oriented x 3, CN II-XII grossly intact MENTAL STATUS:  appropriate BACK:  Non-tender to palpation, No CVAT SKIN:  Warm, dry, and intact   Results: None

## 2022-07-03 DIAGNOSIS — Z789 Other specified health status: Secondary | ICD-10-CM | POA: Diagnosis not present

## 2022-07-03 DIAGNOSIS — R569 Unspecified convulsions: Secondary | ICD-10-CM | POA: Diagnosis not present

## 2022-07-03 DIAGNOSIS — I700 Atherosclerosis of aorta: Secondary | ICD-10-CM | POA: Diagnosis not present

## 2022-07-03 DIAGNOSIS — Z299 Encounter for prophylactic measures, unspecified: Secondary | ICD-10-CM | POA: Diagnosis not present

## 2022-07-03 DIAGNOSIS — Z23 Encounter for immunization: Secondary | ICD-10-CM | POA: Diagnosis not present

## 2022-07-03 DIAGNOSIS — I10 Essential (primary) hypertension: Secondary | ICD-10-CM | POA: Diagnosis not present

## 2022-07-03 DIAGNOSIS — L02419 Cutaneous abscess of limb, unspecified: Secondary | ICD-10-CM | POA: Diagnosis not present

## 2022-07-15 DIAGNOSIS — I10 Essential (primary) hypertension: Secondary | ICD-10-CM | POA: Diagnosis not present

## 2022-07-15 DIAGNOSIS — R5383 Other fatigue: Secondary | ICD-10-CM | POA: Diagnosis not present

## 2022-07-15 DIAGNOSIS — E7800 Pure hypercholesterolemia, unspecified: Secondary | ICD-10-CM | POA: Diagnosis not present

## 2022-07-15 DIAGNOSIS — Z125 Encounter for screening for malignant neoplasm of prostate: Secondary | ICD-10-CM | POA: Diagnosis not present

## 2022-07-15 DIAGNOSIS — Z7189 Other specified counseling: Secondary | ICD-10-CM | POA: Diagnosis not present

## 2022-07-15 DIAGNOSIS — Z1331 Encounter for screening for depression: Secondary | ICD-10-CM | POA: Diagnosis not present

## 2022-07-15 DIAGNOSIS — Z681 Body mass index (BMI) 19 or less, adult: Secondary | ICD-10-CM | POA: Diagnosis not present

## 2022-07-15 DIAGNOSIS — Z299 Encounter for prophylactic measures, unspecified: Secondary | ICD-10-CM | POA: Diagnosis not present

## 2022-07-15 DIAGNOSIS — Z0000 Encounter for general adult medical examination without abnormal findings: Secondary | ICD-10-CM | POA: Diagnosis not present

## 2022-07-15 DIAGNOSIS — Z1339 Encounter for screening examination for other mental health and behavioral disorders: Secondary | ICD-10-CM | POA: Diagnosis not present

## 2022-07-15 DIAGNOSIS — Z79899 Other long term (current) drug therapy: Secondary | ICD-10-CM | POA: Diagnosis not present

## 2022-07-15 DIAGNOSIS — J449 Chronic obstructive pulmonary disease, unspecified: Secondary | ICD-10-CM | POA: Diagnosis not present

## 2022-07-29 DIAGNOSIS — H04123 Dry eye syndrome of bilateral lacrimal glands: Secondary | ICD-10-CM | POA: Diagnosis not present

## 2022-07-29 DIAGNOSIS — H25813 Combined forms of age-related cataract, bilateral: Secondary | ICD-10-CM | POA: Diagnosis not present

## 2022-07-29 DIAGNOSIS — H524 Presbyopia: Secondary | ICD-10-CM | POA: Diagnosis not present

## 2022-07-29 DIAGNOSIS — H40013 Open angle with borderline findings, low risk, bilateral: Secondary | ICD-10-CM | POA: Diagnosis not present

## 2022-07-29 DIAGNOSIS — H353132 Nonexudative age-related macular degeneration, bilateral, intermediate dry stage: Secondary | ICD-10-CM | POA: Diagnosis not present

## 2022-08-23 DIAGNOSIS — R739 Hyperglycemia, unspecified: Secondary | ICD-10-CM | POA: Diagnosis not present

## 2022-08-23 DIAGNOSIS — R52 Pain, unspecified: Secondary | ICD-10-CM | POA: Diagnosis not present

## 2022-08-23 DIAGNOSIS — Z299 Encounter for prophylactic measures, unspecified: Secondary | ICD-10-CM | POA: Diagnosis not present

## 2022-08-23 DIAGNOSIS — R972 Elevated prostate specific antigen [PSA]: Secondary | ICD-10-CM | POA: Diagnosis not present

## 2022-08-23 DIAGNOSIS — I10 Essential (primary) hypertension: Secondary | ICD-10-CM | POA: Diagnosis not present

## 2022-08-23 DIAGNOSIS — N1831 Chronic kidney disease, stage 3a: Secondary | ICD-10-CM | POA: Diagnosis not present

## 2022-09-03 ENCOUNTER — Encounter: Payer: Self-pay | Admitting: Urology

## 2022-10-02 DIAGNOSIS — R059 Cough, unspecified: Secondary | ICD-10-CM | POA: Diagnosis not present

## 2022-10-02 DIAGNOSIS — J449 Chronic obstructive pulmonary disease, unspecified: Secondary | ICD-10-CM | POA: Diagnosis not present

## 2022-10-02 DIAGNOSIS — Z681 Body mass index (BMI) 19 or less, adult: Secondary | ICD-10-CM | POA: Diagnosis not present

## 2022-10-02 DIAGNOSIS — Z299 Encounter for prophylactic measures, unspecified: Secondary | ICD-10-CM | POA: Diagnosis not present

## 2022-10-02 DIAGNOSIS — J069 Acute upper respiratory infection, unspecified: Secondary | ICD-10-CM | POA: Diagnosis not present

## 2022-10-02 DIAGNOSIS — R509 Fever, unspecified: Secondary | ICD-10-CM | POA: Diagnosis not present

## 2022-10-02 DIAGNOSIS — I700 Atherosclerosis of aorta: Secondary | ICD-10-CM | POA: Diagnosis not present

## 2022-10-10 IMAGING — CT CT ABD-PELV W/ CM
3 of 5 series · 14 of 36 positions shown, 16 images · IV contrast (Omnipaque or Isovue)
Comparison: None.

CLINICAL DATA: 79-year-old male with unintentional weight loss.

EXAM:
CT CHEST, ABDOMEN, AND PELVIS WITH CONTRAST
TECHNIQUE: Multidetector CT imaging of the chest, abdomen and pelvis was
performed following the standard protocol during bolus
administration of intravenous contrast.
CONTRAST:  100mL OMNIPAQUE IOHEXOL 300 MG/ML  SOLN

[Series 2: cap with · axial · 0.77mm/px · z∈[-554,-54]mm · 8 of 130 slices shown, 10 images]
[im 15/130  mediastinal]
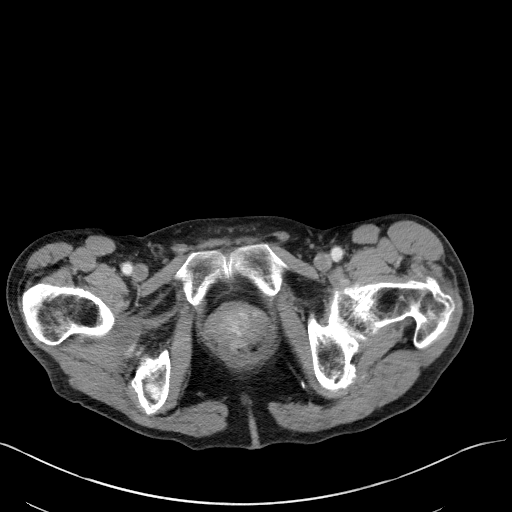
[im 15/130  lung]
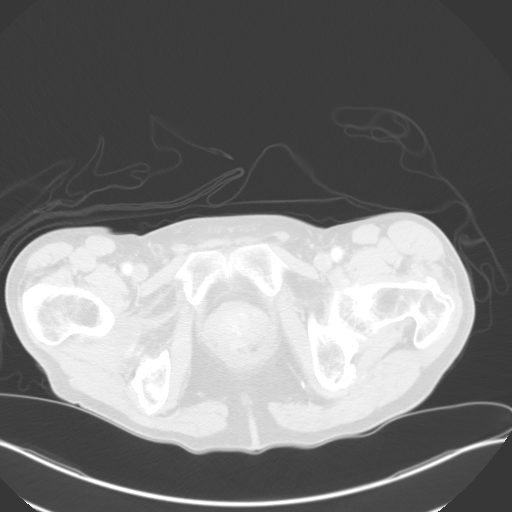
[im 29/130  lung]
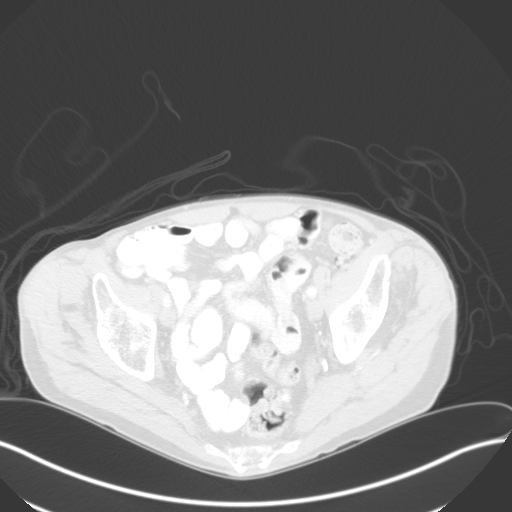
[im 44/130  lung]
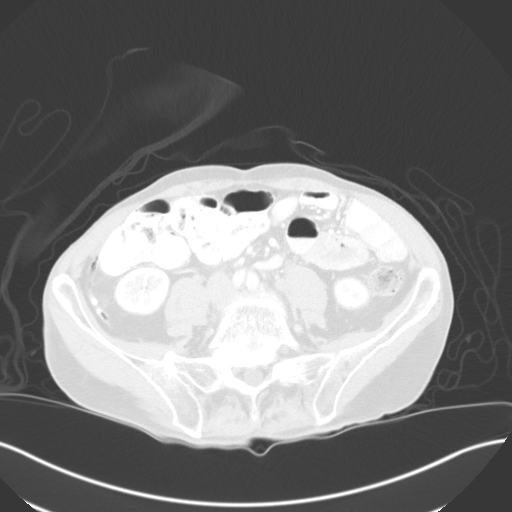
[im 58/130  lung]
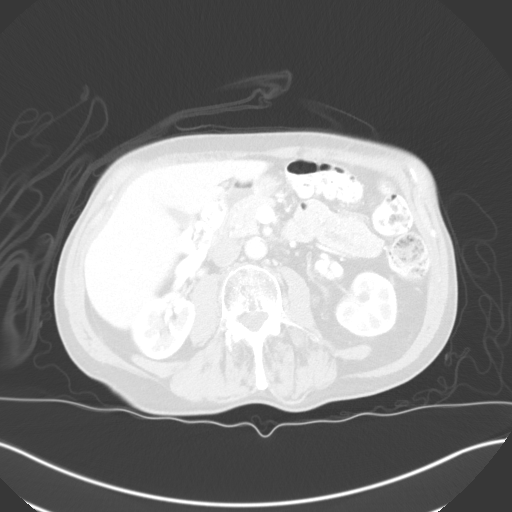
[im 72/130  mediastinal]
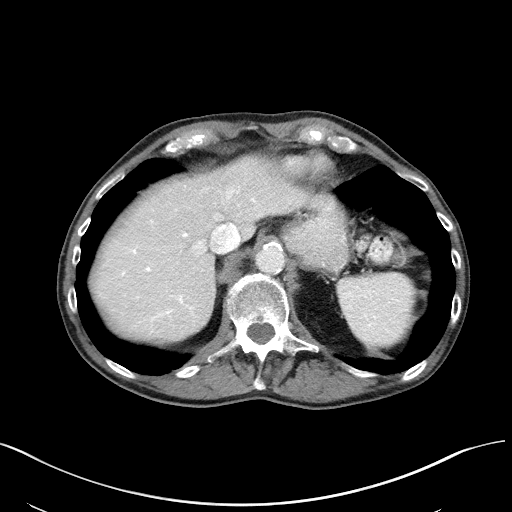
[im 72/130  lung]
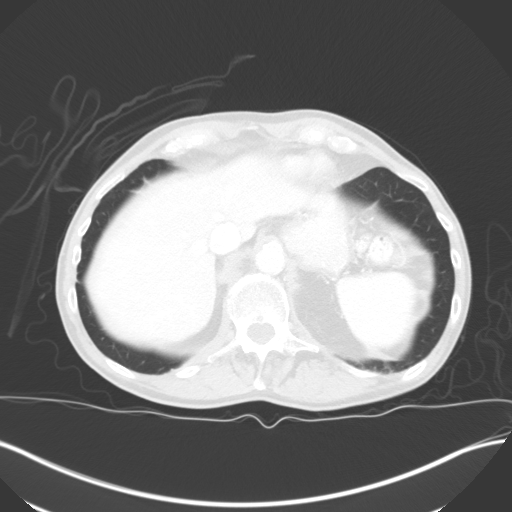
[im 87/130  lung]
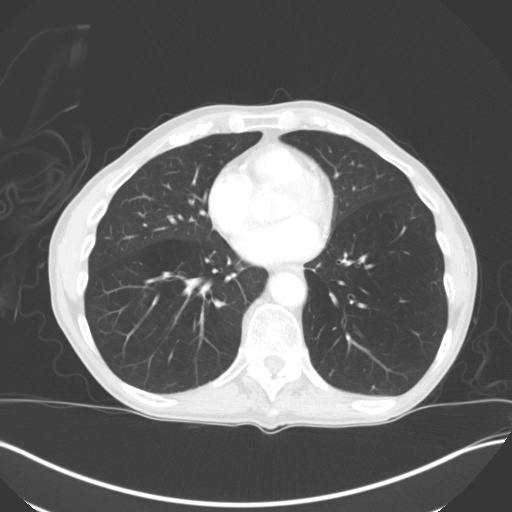
[im 101/130  lung]
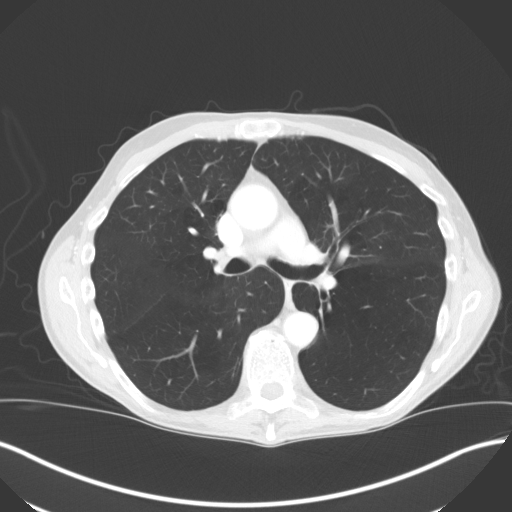
[im 115/130  lung]
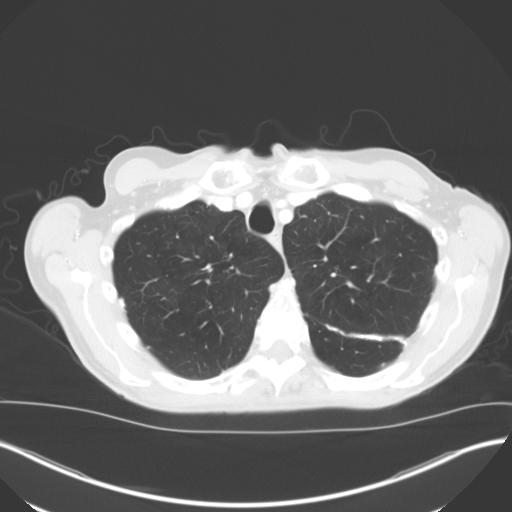

[Series 3: lung · axial · 0.77mm/px · z∈[-316,-232]mm · 3 of 183 slices shown]
[im 15/183  lung]
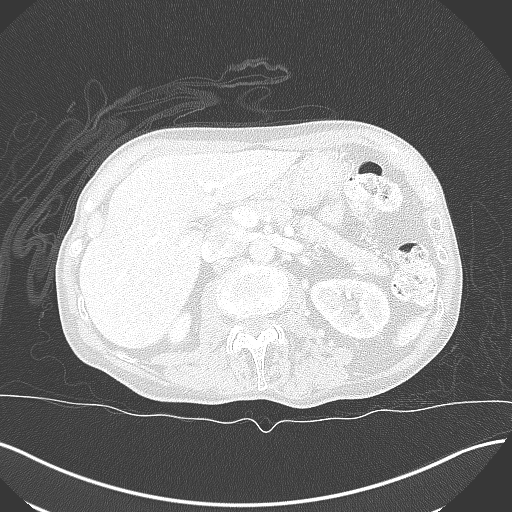
[im 43/183  lung]
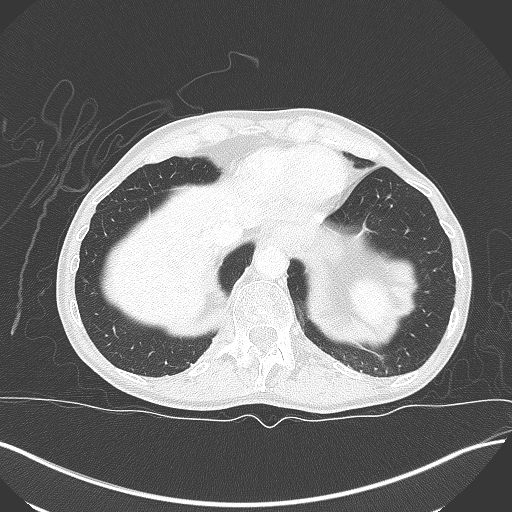
[im 57/183  lung]
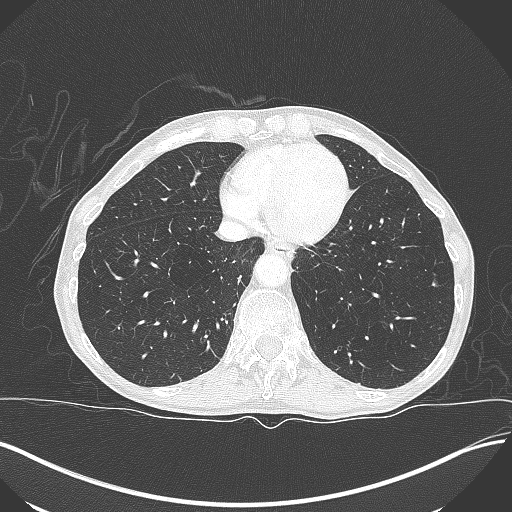

[Series 4: coronals · coronal · 0.78mm/px · 3 of 161 slices shown]
[im 33/161  lung]
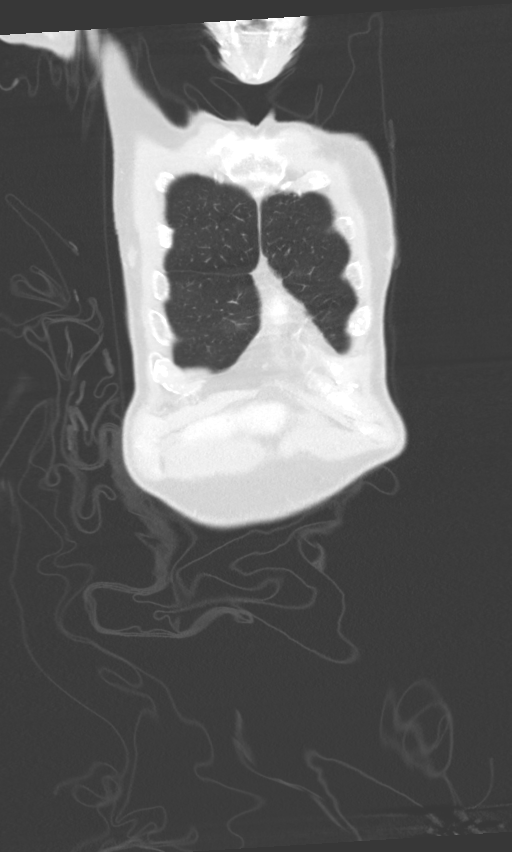
[im 65/161  lung]
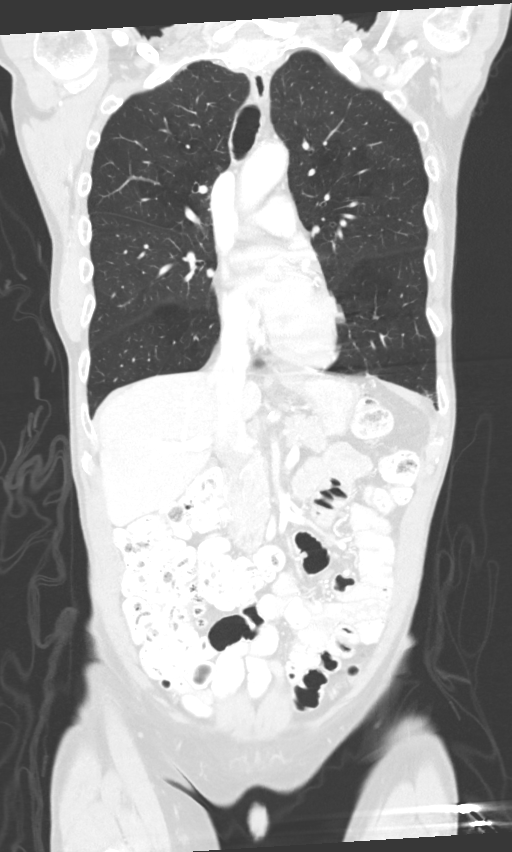
[im 97/161  lung]
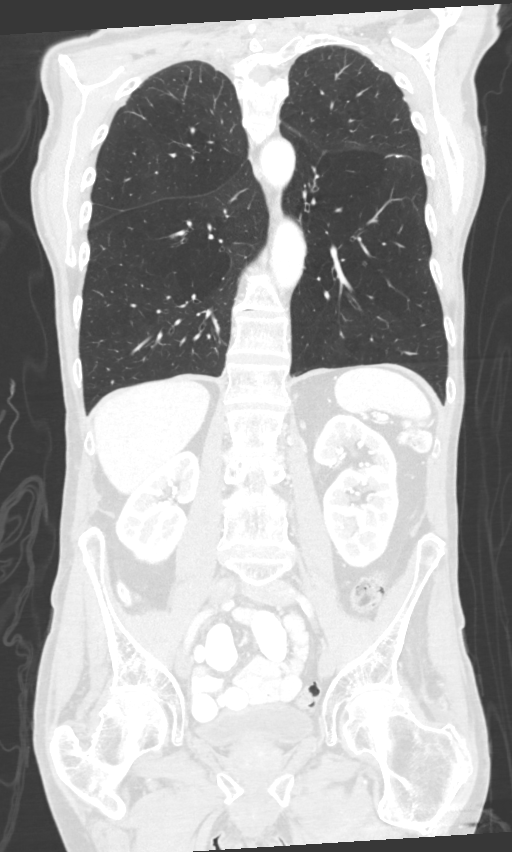

[14 of 36 positions shown; findings below may reference images not displayed]

FINDINGS: CT CHEST FINDINGS

Cardiovascular: There is no cardiomegaly or pericardial effusion.
There is 3 vessel advanced coronary vascular calcification. Moderate
atherosclerotic calcification of the thoracic aorta. No aneurysmal
dilatation or dissection. The origins of the great vessels of the
aortic arch appear patent as visualized. The central pulmonary
arteries are unremarkable.

Mediastinum/Nodes: There is no hilar or mediastinal adenopathy. The
esophagus is grossly unremarkable. No mediastinal fluid collection.

Lungs/Pleura: Partially calcified linear scarring along the left
fissure. There is an 8 mm left lower lobe nodule. There is a 6 mm
nodule along the right major fissure (94/3). There is background of
mild emphysema. There are biapical subpleural scarring. No focal
consolidation, pleural effusion or pneumothorax. The central airways
are patent.

Musculoskeletal: Osteopenia with degenerative changes of the spine.
No acute osseous pathology. Old T7 compression fracture with
anterior wedging.

CT ABDOMEN PELVIS FINDINGS

No intra-abdominal free air or free fluid.

Hepatobiliary: Several subcentimeter scattered hepatic hypodense
lesions are not characterized. No intrahepatic biliary dilatation.
The gallbladder is unremarkable.

Pancreas: Unremarkable. No pancreatic ductal dilatation or
surrounding inflammatory changes.

Spleen: Normal in size without focal abnormality.

Adrenals/Urinary Tract: The adrenal glands unremarkable. There is no
hydronephrosis on either side. There is symmetric enhancement and
excretion of contrast by both kidneys. Subcentimeter left renal
hypodense lesions are too small to characterize. The visualized
ureters and urinary bladder appear unremarkable.

Stomach/Bowel: There is sigmoid diverticulosis without active
inflammatory changes. There is no bowel obstruction or active
inflammation. The appendix is normal.

Vascular/Lymphatic: Moderate aortoiliac atherosclerotic disease. The
IVC is unremarkable. No portal venous gas. There is no adenopathy.

Reproductive: The prostate and seminal vesicles are grossly
unremarkable.

Other: None

Musculoskeletal: Osteopenia with degenerative changes of the spine.
No acute osseous pathology.
IMPRESSION: 1. No acute intrathoracic, abdominal, or pelvic pathology.
2. Emphysema.
3. Bilateral pulmonary nodules measuring up to 8 mm in the left
lower lobe. Non-contrast chest CT at 3-6 months is recommended. If
the nodules are stable at time of repeat CT, then future CT at 18-24
months (from today's scan) is considered optional for low-risk
patients, but is recommended for high-risk patients. This
recommendation follows the consensus statement: Guidelines for
Management of Incidental Pulmonary Nodules Detected on CT Images:
4. Sigmoid diverticulosis.
5. Aortic Atherosclerosis (CDUQ8-4ZF.F) and Emphysema (CDUQ8-6OM.X).

## 2022-10-10 IMAGING — CT CT CHEST W/ CM
3 of 5 series · 14 of 36 positions shown, 16 images · IV contrast (Omnipaque or Isovue)
Comparison: None.

CLINICAL DATA: 79-year-old male with unintentional weight loss.

EXAM:
CT CHEST, ABDOMEN, AND PELVIS WITH CONTRAST
TECHNIQUE: Multidetector CT imaging of the chest, abdomen and pelvis was
performed following the standard protocol during bolus
administration of intravenous contrast.
CONTRAST:  100mL OMNIPAQUE IOHEXOL 300 MG/ML  SOLN

[Series 2: cap with · axial · 0.77mm/px · z∈[-554,-54]mm · 8 of 130 slices shown, 10 images]
[im 15/130  mediastinal]
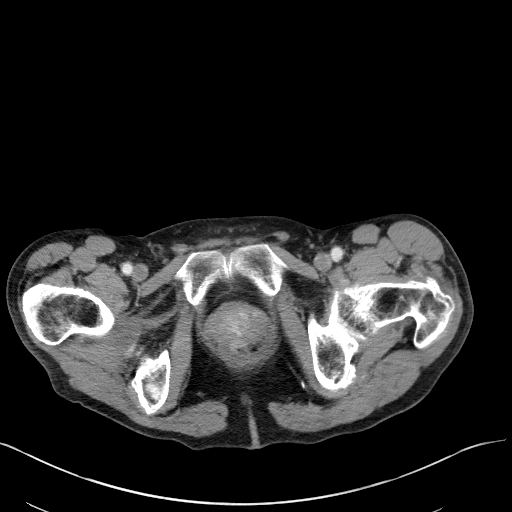
[im 15/130  lung]
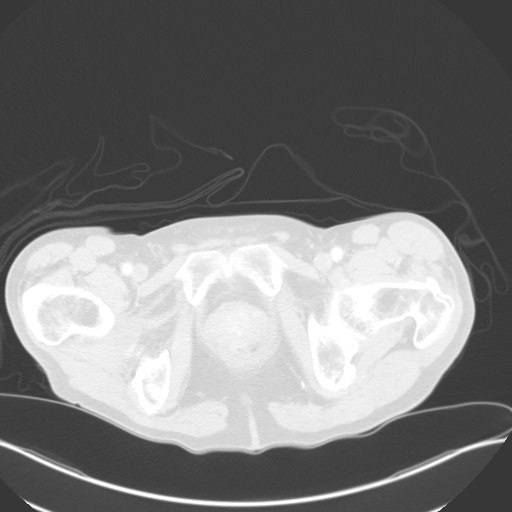
[im 29/130  lung]
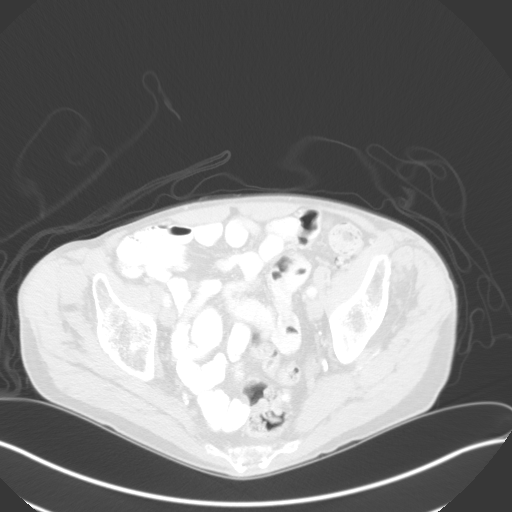
[im 44/130  lung]
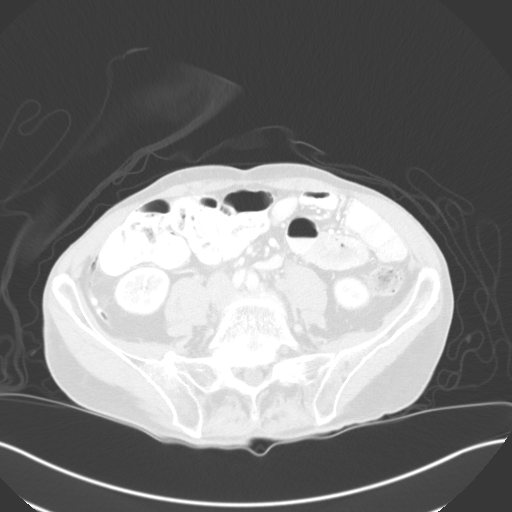
[im 58/130  lung]
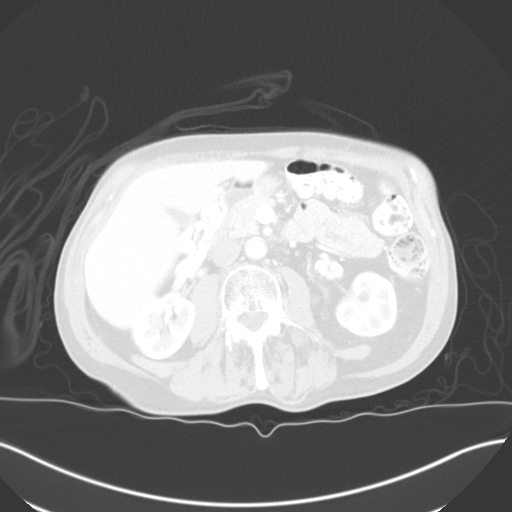
[im 72/130  mediastinal]
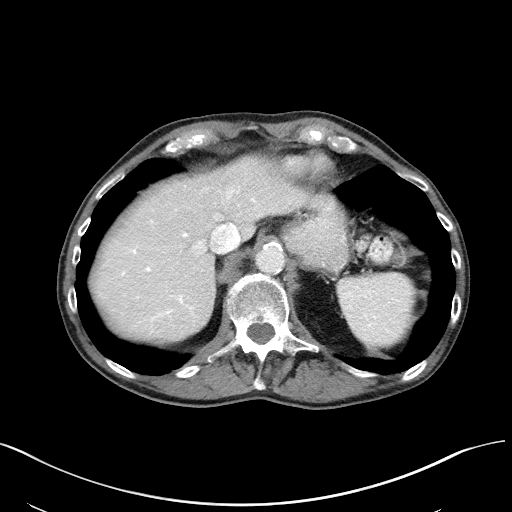
[im 72/130  lung]
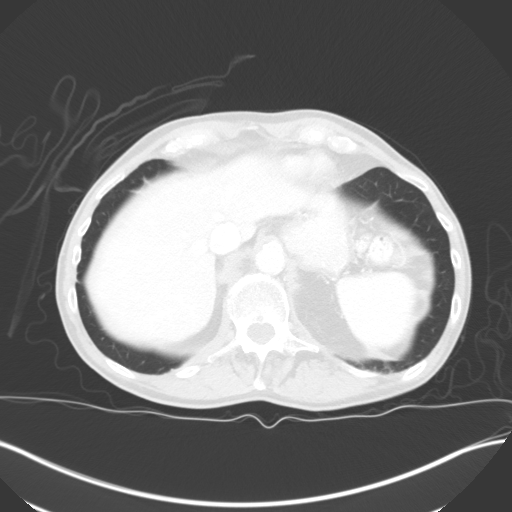
[im 87/130  lung]
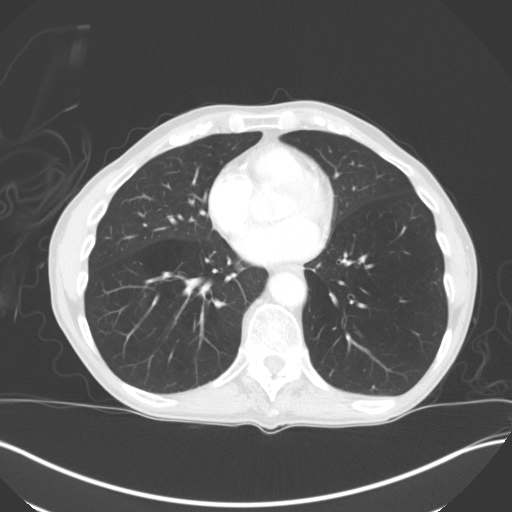
[im 101/130  lung]
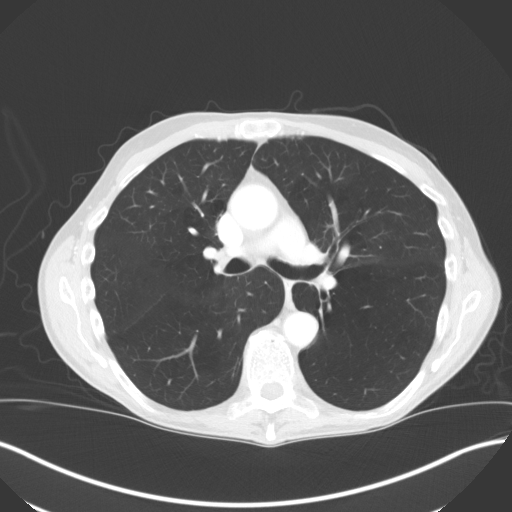
[im 115/130  lung]
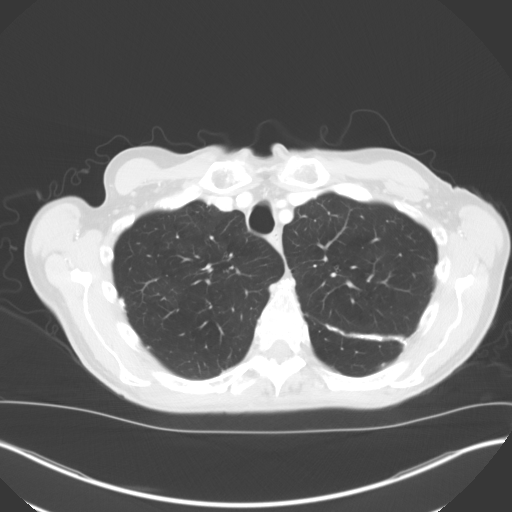

[Series 3: lung · axial · 0.77mm/px · z∈[-316,-232]mm · 3 of 183 slices shown]
[im 15/183  lung]
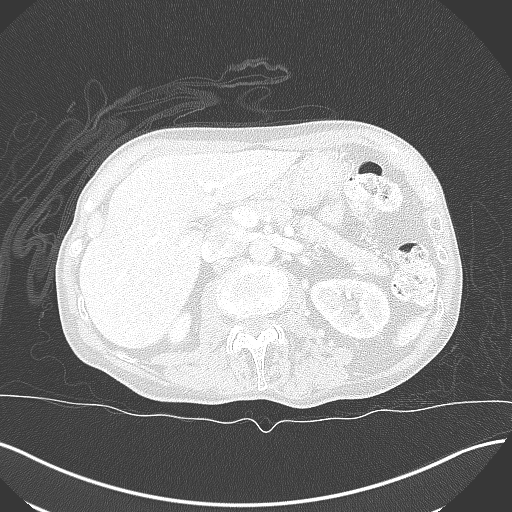
[im 43/183  lung]
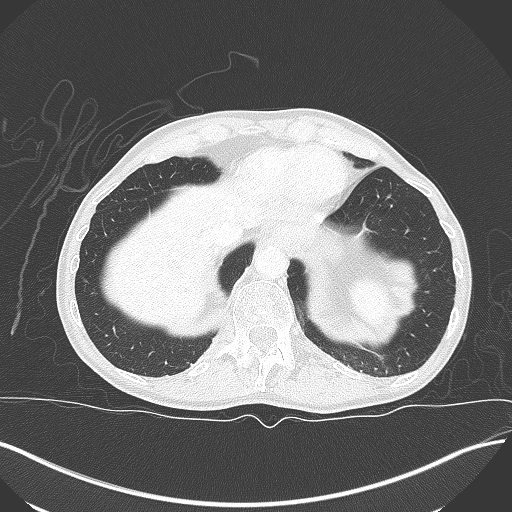
[im 57/183  lung]
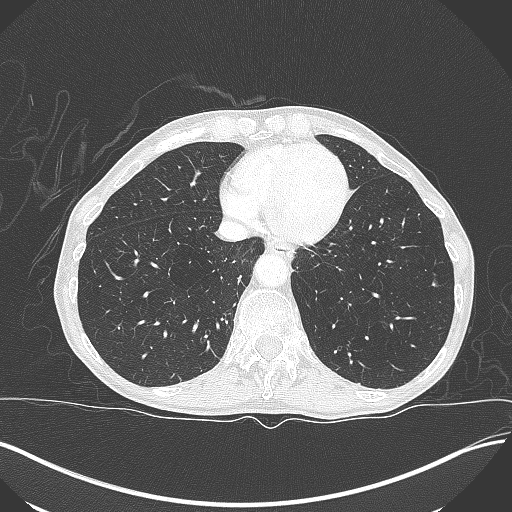

[Series 4: coronals · coronal · 0.78mm/px · 3 of 161 slices shown]
[im 33/161  lung]
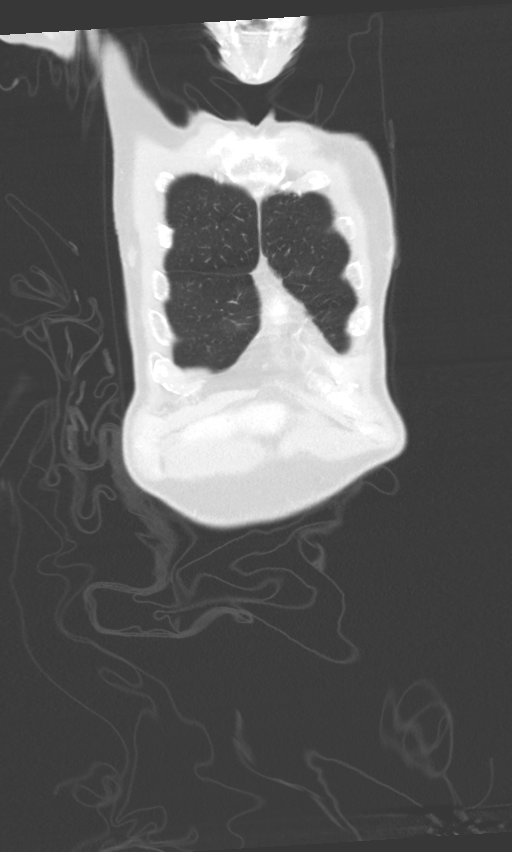
[im 65/161  lung]
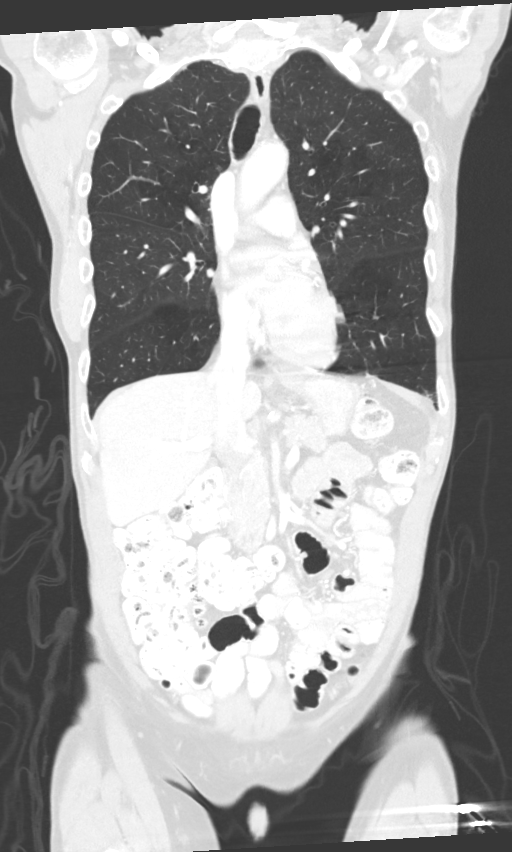
[im 97/161  lung]
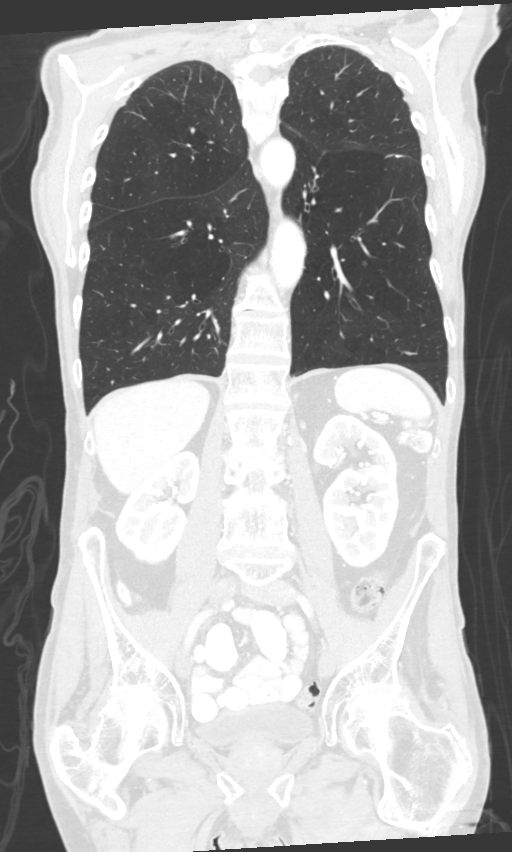

[14 of 36 positions shown; findings below may reference images not displayed]

FINDINGS: CT CHEST FINDINGS

Cardiovascular: There is no cardiomegaly or pericardial effusion.
There is 3 vessel advanced coronary vascular calcification. Moderate
atherosclerotic calcification of the thoracic aorta. No aneurysmal
dilatation or dissection. The origins of the great vessels of the
aortic arch appear patent as visualized. The central pulmonary
arteries are unremarkable.

Mediastinum/Nodes: There is no hilar or mediastinal adenopathy. The
esophagus is grossly unremarkable. No mediastinal fluid collection.

Lungs/Pleura: Partially calcified linear scarring along the left
fissure. There is an 8 mm left lower lobe nodule. There is a 6 mm
nodule along the right major fissure (94/3). There is background of
mild emphysema. There are biapical subpleural scarring. No focal
consolidation, pleural effusion or pneumothorax. The central airways
are patent.

Musculoskeletal: Osteopenia with degenerative changes of the spine.
No acute osseous pathology. Old T7 compression fracture with
anterior wedging.

CT ABDOMEN PELVIS FINDINGS

No intra-abdominal free air or free fluid.

Hepatobiliary: Several subcentimeter scattered hepatic hypodense
lesions are not characterized. No intrahepatic biliary dilatation.
The gallbladder is unremarkable.

Pancreas: Unremarkable. No pancreatic ductal dilatation or
surrounding inflammatory changes.

Spleen: Normal in size without focal abnormality.

Adrenals/Urinary Tract: The adrenal glands unremarkable. There is no
hydronephrosis on either side. There is symmetric enhancement and
excretion of contrast by both kidneys. Subcentimeter left renal
hypodense lesions are too small to characterize. The visualized
ureters and urinary bladder appear unremarkable.

Stomach/Bowel: There is sigmoid diverticulosis without active
inflammatory changes. There is no bowel obstruction or active
inflammation. The appendix is normal.

Vascular/Lymphatic: Moderate aortoiliac atherosclerotic disease. The
IVC is unremarkable. No portal venous gas. There is no adenopathy.

Reproductive: The prostate and seminal vesicles are grossly
unremarkable.

Other: None

Musculoskeletal: Osteopenia with degenerative changes of the spine.
No acute osseous pathology.
IMPRESSION: 1. No acute intrathoracic, abdominal, or pelvic pathology.
2. Emphysema.
3. Bilateral pulmonary nodules measuring up to 8 mm in the left
lower lobe. Non-contrast chest CT at 3-6 months is recommended. If
the nodules are stable at time of repeat CT, then future CT at 18-24
months (from today's scan) is considered optional for low-risk
patients, but is recommended for high-risk patients. This
recommendation follows the consensus statement: Guidelines for
Management of Incidental Pulmonary Nodules Detected on CT Images:
4. Sigmoid diverticulosis.
5. Aortic Atherosclerosis (CDUQ8-4ZF.F) and Emphysema ([id number].X).

## 2022-12-02 DIAGNOSIS — I69351 Hemiplegia and hemiparesis following cerebral infarction affecting right dominant side: Secondary | ICD-10-CM | POA: Diagnosis not present

## 2022-12-02 DIAGNOSIS — E440 Moderate protein-calorie malnutrition: Secondary | ICD-10-CM | POA: Diagnosis not present

## 2022-12-02 DIAGNOSIS — I10 Essential (primary) hypertension: Secondary | ICD-10-CM | POA: Diagnosis not present

## 2022-12-02 DIAGNOSIS — I700 Atherosclerosis of aorta: Secondary | ICD-10-CM | POA: Diagnosis not present

## 2022-12-02 DIAGNOSIS — Z299 Encounter for prophylactic measures, unspecified: Secondary | ICD-10-CM | POA: Diagnosis not present

## 2023-01-21 DIAGNOSIS — F339 Major depressive disorder, recurrent, unspecified: Secondary | ICD-10-CM | POA: Diagnosis not present

## 2023-01-21 DIAGNOSIS — I69351 Hemiplegia and hemiparesis following cerebral infarction affecting right dominant side: Secondary | ICD-10-CM | POA: Diagnosis not present

## 2023-01-21 DIAGNOSIS — E871 Hypo-osmolality and hyponatremia: Secondary | ICD-10-CM | POA: Diagnosis not present

## 2023-01-21 DIAGNOSIS — R4701 Aphasia: Secondary | ICD-10-CM | POA: Diagnosis not present

## 2023-01-21 DIAGNOSIS — S72011A Unspecified intracapsular fracture of right femur, initial encounter for closed fracture: Secondary | ICD-10-CM | POA: Diagnosis not present

## 2023-01-21 DIAGNOSIS — S22069S Unspecified fracture of T7-T8 vertebra, sequela: Secondary | ICD-10-CM | POA: Diagnosis not present

## 2023-01-21 DIAGNOSIS — R1312 Dysphagia, oropharyngeal phase: Secondary | ICD-10-CM | POA: Diagnosis not present

## 2023-01-21 DIAGNOSIS — Z681 Body mass index (BMI) 19 or less, adult: Secondary | ICD-10-CM | POA: Diagnosis not present

## 2023-01-21 DIAGNOSIS — K219 Gastro-esophageal reflux disease without esophagitis: Secondary | ICD-10-CM | POA: Diagnosis not present

## 2023-01-21 DIAGNOSIS — I6930 Unspecified sequelae of cerebral infarction: Secondary | ICD-10-CM | POA: Diagnosis not present

## 2023-01-21 DIAGNOSIS — K2100 Gastro-esophageal reflux disease with esophagitis, without bleeding: Secondary | ICD-10-CM | POA: Diagnosis not present

## 2023-01-21 DIAGNOSIS — Z471 Aftercare following joint replacement surgery: Secondary | ICD-10-CM | POA: Diagnosis not present

## 2023-01-21 DIAGNOSIS — M84451A Pathological fracture, right femur, initial encounter for fracture: Secondary | ICD-10-CM | POA: Diagnosis not present

## 2023-01-21 DIAGNOSIS — F29 Unspecified psychosis not due to a substance or known physiological condition: Secondary | ICD-10-CM | POA: Diagnosis not present

## 2023-01-21 DIAGNOSIS — I69359 Hemiplegia and hemiparesis following cerebral infarction affecting unspecified side: Secondary | ICD-10-CM | POA: Diagnosis not present

## 2023-01-21 DIAGNOSIS — S72001D Fracture of unspecified part of neck of right femur, subsequent encounter for closed fracture with routine healing: Secondary | ICD-10-CM | POA: Diagnosis not present

## 2023-01-21 DIAGNOSIS — S72001A Fracture of unspecified part of neck of right femur, initial encounter for closed fracture: Secondary | ICD-10-CM | POA: Diagnosis not present

## 2023-01-21 DIAGNOSIS — Z79899 Other long term (current) drug therapy: Secondary | ICD-10-CM | POA: Diagnosis not present

## 2023-01-21 DIAGNOSIS — Z87891 Personal history of nicotine dependence: Secondary | ICD-10-CM | POA: Diagnosis not present

## 2023-01-21 DIAGNOSIS — Z91040 Latex allergy status: Secondary | ICD-10-CM | POA: Diagnosis not present

## 2023-01-21 DIAGNOSIS — W07XXXA Fall from chair, initial encounter: Secondary | ICD-10-CM | POA: Diagnosis not present

## 2023-01-21 DIAGNOSIS — M6281 Muscle weakness (generalized): Secondary | ICD-10-CM | POA: Diagnosis not present

## 2023-01-21 DIAGNOSIS — W19XXXD Unspecified fall, subsequent encounter: Secondary | ICD-10-CM | POA: Diagnosis not present

## 2023-01-21 DIAGNOSIS — N400 Enlarged prostate without lower urinary tract symptoms: Secondary | ICD-10-CM | POA: Diagnosis not present

## 2023-01-21 DIAGNOSIS — M25551 Pain in right hip: Secondary | ICD-10-CM | POA: Diagnosis not present

## 2023-01-21 DIAGNOSIS — E43 Unspecified severe protein-calorie malnutrition: Secondary | ICD-10-CM | POA: Diagnosis not present

## 2023-01-21 DIAGNOSIS — R41841 Cognitive communication deficit: Secondary | ICD-10-CM | POA: Diagnosis not present

## 2023-01-21 DIAGNOSIS — M1611 Unilateral primary osteoarthritis, right hip: Secondary | ICD-10-CM | POA: Diagnosis not present

## 2023-01-21 DIAGNOSIS — J449 Chronic obstructive pulmonary disease, unspecified: Secondary | ICD-10-CM | POA: Diagnosis not present

## 2023-01-21 DIAGNOSIS — M79604 Pain in right leg: Secondary | ICD-10-CM | POA: Diagnosis not present

## 2023-01-21 DIAGNOSIS — Z7982 Long term (current) use of aspirin: Secondary | ICD-10-CM | POA: Diagnosis not present

## 2023-01-21 DIAGNOSIS — Z8781 Personal history of (healed) traumatic fracture: Secondary | ICD-10-CM | POA: Diagnosis not present

## 2023-01-21 DIAGNOSIS — I10 Essential (primary) hypertension: Secondary | ICD-10-CM | POA: Diagnosis not present

## 2023-01-21 DIAGNOSIS — Z4789 Encounter for other orthopedic aftercare: Secondary | ICD-10-CM | POA: Diagnosis not present

## 2023-01-21 DIAGNOSIS — R519 Headache, unspecified: Secondary | ICD-10-CM | POA: Diagnosis not present

## 2023-01-21 DIAGNOSIS — Z96641 Presence of right artificial hip joint: Secondary | ICD-10-CM | POA: Diagnosis not present

## 2023-01-29 DIAGNOSIS — R41841 Cognitive communication deficit: Secondary | ICD-10-CM | POA: Diagnosis not present

## 2023-01-29 DIAGNOSIS — R4701 Aphasia: Secondary | ICD-10-CM | POA: Diagnosis not present

## 2023-01-29 DIAGNOSIS — K219 Gastro-esophageal reflux disease without esophagitis: Secondary | ICD-10-CM | POA: Diagnosis not present

## 2023-01-29 DIAGNOSIS — K2100 Gastro-esophageal reflux disease with esophagitis, without bleeding: Secondary | ICD-10-CM | POA: Diagnosis not present

## 2023-01-29 DIAGNOSIS — E871 Hypo-osmolality and hyponatremia: Secondary | ICD-10-CM | POA: Diagnosis not present

## 2023-01-29 DIAGNOSIS — I69351 Hemiplegia and hemiparesis following cerebral infarction affecting right dominant side: Secondary | ICD-10-CM | POA: Diagnosis not present

## 2023-01-29 DIAGNOSIS — S22069S Unspecified fracture of T7-T8 vertebra, sequela: Secondary | ICD-10-CM | POA: Diagnosis not present

## 2023-01-29 DIAGNOSIS — N400 Enlarged prostate without lower urinary tract symptoms: Secondary | ICD-10-CM | POA: Diagnosis not present

## 2023-01-29 DIAGNOSIS — J449 Chronic obstructive pulmonary disease, unspecified: Secondary | ICD-10-CM | POA: Diagnosis not present

## 2023-01-29 DIAGNOSIS — F339 Major depressive disorder, recurrent, unspecified: Secondary | ICD-10-CM | POA: Diagnosis not present

## 2023-01-29 DIAGNOSIS — L89152 Pressure ulcer of sacral region, stage 2: Secondary | ICD-10-CM | POA: Diagnosis not present

## 2023-01-29 DIAGNOSIS — Z79899 Other long term (current) drug therapy: Secondary | ICD-10-CM | POA: Diagnosis not present

## 2023-01-29 DIAGNOSIS — Z8781 Personal history of (healed) traumatic fracture: Secondary | ICD-10-CM | POA: Diagnosis not present

## 2023-01-29 DIAGNOSIS — Z4789 Encounter for other orthopedic aftercare: Secondary | ICD-10-CM | POA: Diagnosis not present

## 2023-01-29 DIAGNOSIS — S72001D Fracture of unspecified part of neck of right femur, subsequent encounter for closed fracture with routine healing: Secondary | ICD-10-CM | POA: Diagnosis not present

## 2023-01-29 DIAGNOSIS — R1312 Dysphagia, oropharyngeal phase: Secondary | ICD-10-CM | POA: Diagnosis not present

## 2023-01-29 DIAGNOSIS — Z96641 Presence of right artificial hip joint: Secondary | ICD-10-CM | POA: Diagnosis not present

## 2023-01-29 DIAGNOSIS — S72001A Fracture of unspecified part of neck of right femur, initial encounter for closed fracture: Secondary | ICD-10-CM | POA: Diagnosis not present

## 2023-01-29 DIAGNOSIS — I69359 Hemiplegia and hemiparesis following cerebral infarction affecting unspecified side: Secondary | ICD-10-CM | POA: Diagnosis not present

## 2023-01-29 DIAGNOSIS — I6930 Unspecified sequelae of cerebral infarction: Secondary | ICD-10-CM | POA: Diagnosis not present

## 2023-01-29 DIAGNOSIS — Z681 Body mass index (BMI) 19 or less, adult: Secondary | ICD-10-CM | POA: Diagnosis not present

## 2023-01-29 DIAGNOSIS — E559 Vitamin D deficiency, unspecified: Secondary | ICD-10-CM | POA: Diagnosis not present

## 2023-01-29 DIAGNOSIS — I10 Essential (primary) hypertension: Secondary | ICD-10-CM | POA: Diagnosis not present

## 2023-01-29 DIAGNOSIS — M6281 Muscle weakness (generalized): Secondary | ICD-10-CM | POA: Diagnosis not present

## 2023-02-04 DIAGNOSIS — S72001A Fracture of unspecified part of neck of right femur, initial encounter for closed fracture: Secondary | ICD-10-CM | POA: Diagnosis not present

## 2023-02-05 DIAGNOSIS — M6281 Muscle weakness (generalized): Secondary | ICD-10-CM | POA: Diagnosis not present

## 2023-02-05 DIAGNOSIS — L89152 Pressure ulcer of sacral region, stage 2: Secondary | ICD-10-CM | POA: Diagnosis not present

## 2023-02-05 DIAGNOSIS — Z681 Body mass index (BMI) 19 or less, adult: Secondary | ICD-10-CM | POA: Diagnosis not present

## 2023-02-05 DIAGNOSIS — J449 Chronic obstructive pulmonary disease, unspecified: Secondary | ICD-10-CM | POA: Diagnosis not present

## 2023-02-07 DIAGNOSIS — S72001D Fracture of unspecified part of neck of right femur, subsequent encounter for closed fracture with routine healing: Secondary | ICD-10-CM | POA: Diagnosis not present

## 2023-02-12 DIAGNOSIS — M6281 Muscle weakness (generalized): Secondary | ICD-10-CM | POA: Diagnosis not present

## 2023-02-12 DIAGNOSIS — J449 Chronic obstructive pulmonary disease, unspecified: Secondary | ICD-10-CM | POA: Diagnosis not present

## 2023-02-12 DIAGNOSIS — L89152 Pressure ulcer of sacral region, stage 2: Secondary | ICD-10-CM | POA: Diagnosis not present

## 2023-02-12 DIAGNOSIS — Z681 Body mass index (BMI) 19 or less, adult: Secondary | ICD-10-CM | POA: Diagnosis not present

## 2023-02-20 DIAGNOSIS — Z09 Encounter for follow-up examination after completed treatment for conditions other than malignant neoplasm: Secondary | ICD-10-CM | POA: Diagnosis not present

## 2023-02-20 DIAGNOSIS — S72001S Fracture of unspecified part of neck of right femur, sequela: Secondary | ICD-10-CM | POA: Diagnosis not present

## 2023-02-20 DIAGNOSIS — I10 Essential (primary) hypertension: Secondary | ICD-10-CM | POA: Diagnosis not present

## 2023-02-20 DIAGNOSIS — Z299 Encounter for prophylactic measures, unspecified: Secondary | ICD-10-CM | POA: Diagnosis not present

## 2023-02-23 DIAGNOSIS — Z681 Body mass index (BMI) 19 or less, adult: Secondary | ICD-10-CM | POA: Diagnosis not present

## 2023-02-23 DIAGNOSIS — R600 Localized edema: Secondary | ICD-10-CM | POA: Diagnosis not present

## 2023-02-24 DIAGNOSIS — M6281 Muscle weakness (generalized): Secondary | ICD-10-CM | POA: Diagnosis not present

## 2023-02-24 DIAGNOSIS — Z9181 History of falling: Secondary | ICD-10-CM | POA: Diagnosis not present

## 2023-02-24 DIAGNOSIS — Z4789 Encounter for other orthopedic aftercare: Secondary | ICD-10-CM | POA: Diagnosis not present

## 2023-02-24 DIAGNOSIS — R2681 Unsteadiness on feet: Secondary | ICD-10-CM | POA: Diagnosis not present

## 2023-02-28 DIAGNOSIS — R2681 Unsteadiness on feet: Secondary | ICD-10-CM | POA: Diagnosis not present

## 2023-02-28 DIAGNOSIS — Z4789 Encounter for other orthopedic aftercare: Secondary | ICD-10-CM | POA: Diagnosis not present

## 2023-02-28 DIAGNOSIS — M6281 Muscle weakness (generalized): Secondary | ICD-10-CM | POA: Diagnosis not present

## 2023-02-28 DIAGNOSIS — Z9181 History of falling: Secondary | ICD-10-CM | POA: Diagnosis not present

## 2023-03-03 DIAGNOSIS — R2681 Unsteadiness on feet: Secondary | ICD-10-CM | POA: Diagnosis not present

## 2023-03-03 DIAGNOSIS — Z4789 Encounter for other orthopedic aftercare: Secondary | ICD-10-CM | POA: Diagnosis not present

## 2023-03-03 DIAGNOSIS — Z9181 History of falling: Secondary | ICD-10-CM | POA: Diagnosis not present

## 2023-03-03 DIAGNOSIS — M6281 Muscle weakness (generalized): Secondary | ICD-10-CM | POA: Diagnosis not present

## 2023-03-05 DIAGNOSIS — Z9181 History of falling: Secondary | ICD-10-CM | POA: Diagnosis not present

## 2023-03-05 DIAGNOSIS — M6281 Muscle weakness (generalized): Secondary | ICD-10-CM | POA: Diagnosis not present

## 2023-03-05 DIAGNOSIS — Z4789 Encounter for other orthopedic aftercare: Secondary | ICD-10-CM | POA: Diagnosis not present

## 2023-03-05 DIAGNOSIS — R2681 Unsteadiness on feet: Secondary | ICD-10-CM | POA: Diagnosis not present

## 2023-03-06 DIAGNOSIS — S72001D Fracture of unspecified part of neck of right femur, subsequent encounter for closed fracture with routine healing: Secondary | ICD-10-CM | POA: Diagnosis not present

## 2023-03-10 DIAGNOSIS — R2681 Unsteadiness on feet: Secondary | ICD-10-CM | POA: Diagnosis not present

## 2023-03-10 DIAGNOSIS — Z9181 History of falling: Secondary | ICD-10-CM | POA: Diagnosis not present

## 2023-03-10 DIAGNOSIS — M6281 Muscle weakness (generalized): Secondary | ICD-10-CM | POA: Diagnosis not present

## 2023-03-10 DIAGNOSIS — Z4789 Encounter for other orthopedic aftercare: Secondary | ICD-10-CM | POA: Diagnosis not present

## 2023-03-12 DIAGNOSIS — M6281 Muscle weakness (generalized): Secondary | ICD-10-CM | POA: Diagnosis not present

## 2023-03-12 DIAGNOSIS — Z4789 Encounter for other orthopedic aftercare: Secondary | ICD-10-CM | POA: Diagnosis not present

## 2023-03-12 DIAGNOSIS — Z9181 History of falling: Secondary | ICD-10-CM | POA: Diagnosis not present

## 2023-03-12 DIAGNOSIS — R2681 Unsteadiness on feet: Secondary | ICD-10-CM | POA: Diagnosis not present

## 2023-03-17 DIAGNOSIS — Z4789 Encounter for other orthopedic aftercare: Secondary | ICD-10-CM | POA: Diagnosis not present

## 2023-03-17 DIAGNOSIS — M6281 Muscle weakness (generalized): Secondary | ICD-10-CM | POA: Diagnosis not present

## 2023-03-17 DIAGNOSIS — R2681 Unsteadiness on feet: Secondary | ICD-10-CM | POA: Diagnosis not present

## 2023-03-17 DIAGNOSIS — Z9181 History of falling: Secondary | ICD-10-CM | POA: Diagnosis not present

## 2023-03-19 DIAGNOSIS — R2681 Unsteadiness on feet: Secondary | ICD-10-CM | POA: Diagnosis not present

## 2023-03-19 DIAGNOSIS — M6281 Muscle weakness (generalized): Secondary | ICD-10-CM | POA: Diagnosis not present

## 2023-03-19 DIAGNOSIS — Z9181 History of falling: Secondary | ICD-10-CM | POA: Diagnosis not present

## 2023-03-19 DIAGNOSIS — Z4789 Encounter for other orthopedic aftercare: Secondary | ICD-10-CM | POA: Diagnosis not present

## 2023-03-26 DIAGNOSIS — R2681 Unsteadiness on feet: Secondary | ICD-10-CM | POA: Diagnosis not present

## 2023-03-26 DIAGNOSIS — M6281 Muscle weakness (generalized): Secondary | ICD-10-CM | POA: Diagnosis not present

## 2023-03-26 DIAGNOSIS — Z9181 History of falling: Secondary | ICD-10-CM | POA: Diagnosis not present

## 2023-03-26 DIAGNOSIS — Z4789 Encounter for other orthopedic aftercare: Secondary | ICD-10-CM | POA: Diagnosis not present

## 2023-03-28 DIAGNOSIS — Z4789 Encounter for other orthopedic aftercare: Secondary | ICD-10-CM | POA: Diagnosis not present

## 2023-03-28 DIAGNOSIS — M6281 Muscle weakness (generalized): Secondary | ICD-10-CM | POA: Diagnosis not present

## 2023-03-28 DIAGNOSIS — R2681 Unsteadiness on feet: Secondary | ICD-10-CM | POA: Diagnosis not present

## 2023-03-28 DIAGNOSIS — Z9181 History of falling: Secondary | ICD-10-CM | POA: Diagnosis not present

## 2023-03-31 DIAGNOSIS — R2681 Unsteadiness on feet: Secondary | ICD-10-CM | POA: Diagnosis not present

## 2023-03-31 DIAGNOSIS — Z4789 Encounter for other orthopedic aftercare: Secondary | ICD-10-CM | POA: Diagnosis not present

## 2023-03-31 DIAGNOSIS — M6281 Muscle weakness (generalized): Secondary | ICD-10-CM | POA: Diagnosis not present

## 2023-03-31 DIAGNOSIS — Z9181 History of falling: Secondary | ICD-10-CM | POA: Diagnosis not present

## 2023-04-02 DIAGNOSIS — Z9181 History of falling: Secondary | ICD-10-CM | POA: Diagnosis not present

## 2023-04-02 DIAGNOSIS — R2681 Unsteadiness on feet: Secondary | ICD-10-CM | POA: Diagnosis not present

## 2023-04-02 DIAGNOSIS — M6281 Muscle weakness (generalized): Secondary | ICD-10-CM | POA: Diagnosis not present

## 2023-04-02 DIAGNOSIS — Z4789 Encounter for other orthopedic aftercare: Secondary | ICD-10-CM | POA: Diagnosis not present

## 2023-04-07 DIAGNOSIS — I10 Essential (primary) hypertension: Secondary | ICD-10-CM | POA: Diagnosis not present

## 2023-04-07 DIAGNOSIS — Z1339 Encounter for screening examination for other mental health and behavioral disorders: Secondary | ICD-10-CM | POA: Diagnosis not present

## 2023-04-07 DIAGNOSIS — Z299 Encounter for prophylactic measures, unspecified: Secondary | ICD-10-CM | POA: Diagnosis not present

## 2023-04-07 DIAGNOSIS — Z1331 Encounter for screening for depression: Secondary | ICD-10-CM | POA: Diagnosis not present

## 2023-04-07 DIAGNOSIS — I779 Disorder of arteries and arterioles, unspecified: Secondary | ICD-10-CM | POA: Diagnosis not present

## 2023-04-07 DIAGNOSIS — I700 Atherosclerosis of aorta: Secondary | ICD-10-CM | POA: Diagnosis not present

## 2023-04-07 DIAGNOSIS — Z7189 Other specified counseling: Secondary | ICD-10-CM | POA: Diagnosis not present

## 2023-04-07 DIAGNOSIS — Z0000 Encounter for general adult medical examination without abnormal findings: Secondary | ICD-10-CM | POA: Diagnosis not present

## 2023-04-08 DIAGNOSIS — R2681 Unsteadiness on feet: Secondary | ICD-10-CM | POA: Diagnosis not present

## 2023-04-08 DIAGNOSIS — M6281 Muscle weakness (generalized): Secondary | ICD-10-CM | POA: Diagnosis not present

## 2023-04-08 DIAGNOSIS — Z9181 History of falling: Secondary | ICD-10-CM | POA: Diagnosis not present

## 2023-04-08 DIAGNOSIS — Z4789 Encounter for other orthopedic aftercare: Secondary | ICD-10-CM | POA: Diagnosis not present

## 2023-04-10 DIAGNOSIS — Z9181 History of falling: Secondary | ICD-10-CM | POA: Diagnosis not present

## 2023-04-10 DIAGNOSIS — R2681 Unsteadiness on feet: Secondary | ICD-10-CM | POA: Diagnosis not present

## 2023-04-10 DIAGNOSIS — M6281 Muscle weakness (generalized): Secondary | ICD-10-CM | POA: Diagnosis not present

## 2023-04-10 DIAGNOSIS — Z4789 Encounter for other orthopedic aftercare: Secondary | ICD-10-CM | POA: Diagnosis not present

## 2023-04-16 DIAGNOSIS — R2681 Unsteadiness on feet: Secondary | ICD-10-CM | POA: Diagnosis not present

## 2023-04-16 DIAGNOSIS — Z4789 Encounter for other orthopedic aftercare: Secondary | ICD-10-CM | POA: Diagnosis not present

## 2023-04-16 DIAGNOSIS — M6281 Muscle weakness (generalized): Secondary | ICD-10-CM | POA: Diagnosis not present

## 2023-04-16 DIAGNOSIS — Z9181 History of falling: Secondary | ICD-10-CM | POA: Diagnosis not present

## 2023-04-17 DIAGNOSIS — S72001D Fracture of unspecified part of neck of right femur, subsequent encounter for closed fracture with routine healing: Secondary | ICD-10-CM | POA: Diagnosis not present

## 2023-04-18 DIAGNOSIS — M6281 Muscle weakness (generalized): Secondary | ICD-10-CM | POA: Diagnosis not present

## 2023-04-18 DIAGNOSIS — Z9181 History of falling: Secondary | ICD-10-CM | POA: Diagnosis not present

## 2023-04-18 DIAGNOSIS — Z4789 Encounter for other orthopedic aftercare: Secondary | ICD-10-CM | POA: Diagnosis not present

## 2023-04-18 DIAGNOSIS — R2681 Unsteadiness on feet: Secondary | ICD-10-CM | POA: Diagnosis not present

## 2023-04-23 DIAGNOSIS — M6281 Muscle weakness (generalized): Secondary | ICD-10-CM | POA: Diagnosis not present

## 2023-04-23 DIAGNOSIS — Z9181 History of falling: Secondary | ICD-10-CM | POA: Diagnosis not present

## 2023-04-23 DIAGNOSIS — R2681 Unsteadiness on feet: Secondary | ICD-10-CM | POA: Diagnosis not present

## 2023-04-23 DIAGNOSIS — Z4789 Encounter for other orthopedic aftercare: Secondary | ICD-10-CM | POA: Diagnosis not present

## 2023-04-24 DIAGNOSIS — R2241 Localized swelling, mass and lump, right lower limb: Secondary | ICD-10-CM | POA: Diagnosis not present

## 2023-04-24 DIAGNOSIS — M7989 Other specified soft tissue disorders: Secondary | ICD-10-CM | POA: Diagnosis not present

## 2023-04-30 DIAGNOSIS — Z4789 Encounter for other orthopedic aftercare: Secondary | ICD-10-CM | POA: Diagnosis not present

## 2023-04-30 DIAGNOSIS — M6281 Muscle weakness (generalized): Secondary | ICD-10-CM | POA: Diagnosis not present

## 2023-04-30 DIAGNOSIS — R2681 Unsteadiness on feet: Secondary | ICD-10-CM | POA: Diagnosis not present

## 2023-04-30 DIAGNOSIS — Z9181 History of falling: Secondary | ICD-10-CM | POA: Diagnosis not present

## 2023-05-08 DIAGNOSIS — Z4789 Encounter for other orthopedic aftercare: Secondary | ICD-10-CM | POA: Diagnosis not present

## 2023-05-08 DIAGNOSIS — R2681 Unsteadiness on feet: Secondary | ICD-10-CM | POA: Diagnosis not present

## 2023-05-08 DIAGNOSIS — Z9181 History of falling: Secondary | ICD-10-CM | POA: Diagnosis not present

## 2023-05-08 DIAGNOSIS — M6281 Muscle weakness (generalized): Secondary | ICD-10-CM | POA: Diagnosis not present

## 2023-05-14 DIAGNOSIS — R2681 Unsteadiness on feet: Secondary | ICD-10-CM | POA: Diagnosis not present

## 2023-05-14 DIAGNOSIS — Z9181 History of falling: Secondary | ICD-10-CM | POA: Diagnosis not present

## 2023-05-14 DIAGNOSIS — M6281 Muscle weakness (generalized): Secondary | ICD-10-CM | POA: Diagnosis not present

## 2023-05-14 DIAGNOSIS — Z4789 Encounter for other orthopedic aftercare: Secondary | ICD-10-CM | POA: Diagnosis not present

## 2023-05-22 DIAGNOSIS — R2681 Unsteadiness on feet: Secondary | ICD-10-CM | POA: Diagnosis not present

## 2023-05-22 DIAGNOSIS — M6281 Muscle weakness (generalized): Secondary | ICD-10-CM | POA: Diagnosis not present

## 2023-05-22 DIAGNOSIS — Z9181 History of falling: Secondary | ICD-10-CM | POA: Diagnosis not present

## 2023-05-22 DIAGNOSIS — Z4789 Encounter for other orthopedic aftercare: Secondary | ICD-10-CM | POA: Diagnosis not present

## 2023-05-29 DIAGNOSIS — Z9181 History of falling: Secondary | ICD-10-CM | POA: Diagnosis not present

## 2023-05-29 DIAGNOSIS — M6281 Muscle weakness (generalized): Secondary | ICD-10-CM | POA: Diagnosis not present

## 2023-05-29 DIAGNOSIS — Z4789 Encounter for other orthopedic aftercare: Secondary | ICD-10-CM | POA: Diagnosis not present

## 2023-05-29 DIAGNOSIS — R2681 Unsteadiness on feet: Secondary | ICD-10-CM | POA: Diagnosis not present

## 2023-06-05 DIAGNOSIS — R2681 Unsteadiness on feet: Secondary | ICD-10-CM | POA: Diagnosis not present

## 2023-06-05 DIAGNOSIS — M6281 Muscle weakness (generalized): Secondary | ICD-10-CM | POA: Diagnosis not present

## 2023-06-05 DIAGNOSIS — Z4789 Encounter for other orthopedic aftercare: Secondary | ICD-10-CM | POA: Diagnosis not present

## 2023-06-05 DIAGNOSIS — Z9181 History of falling: Secondary | ICD-10-CM | POA: Diagnosis not present

## 2023-06-16 DIAGNOSIS — I739 Peripheral vascular disease, unspecified: Secondary | ICD-10-CM | POA: Diagnosis not present

## 2023-06-16 DIAGNOSIS — L110 Acquired keratosis follicularis: Secondary | ICD-10-CM | POA: Diagnosis not present

## 2023-06-16 DIAGNOSIS — M79674 Pain in right toe(s): Secondary | ICD-10-CM | POA: Diagnosis not present

## 2023-06-16 DIAGNOSIS — M79675 Pain in left toe(s): Secondary | ICD-10-CM | POA: Diagnosis not present

## 2023-06-16 DIAGNOSIS — M79672 Pain in left foot: Secondary | ICD-10-CM | POA: Diagnosis not present

## 2023-06-16 DIAGNOSIS — M79671 Pain in right foot: Secondary | ICD-10-CM | POA: Diagnosis not present

## 2023-07-02 DIAGNOSIS — Z0000 Encounter for general adult medical examination without abnormal findings: Secondary | ICD-10-CM | POA: Diagnosis not present

## 2023-07-02 DIAGNOSIS — E7800 Pure hypercholesterolemia, unspecified: Secondary | ICD-10-CM | POA: Diagnosis not present

## 2023-07-02 DIAGNOSIS — I10 Essential (primary) hypertension: Secondary | ICD-10-CM | POA: Diagnosis not present

## 2023-07-02 DIAGNOSIS — Z299 Encounter for prophylactic measures, unspecified: Secondary | ICD-10-CM | POA: Diagnosis not present

## 2023-07-02 DIAGNOSIS — R5383 Other fatigue: Secondary | ICD-10-CM | POA: Diagnosis not present

## 2023-07-02 DIAGNOSIS — Z79899 Other long term (current) drug therapy: Secondary | ICD-10-CM | POA: Diagnosis not present

## 2023-07-17 DIAGNOSIS — S72001D Fracture of unspecified part of neck of right femur, subsequent encounter for closed fracture with routine healing: Secondary | ICD-10-CM | POA: Diagnosis not present

## 2023-08-01 DIAGNOSIS — J3489 Other specified disorders of nose and nasal sinuses: Secondary | ICD-10-CM | POA: Diagnosis not present

## 2023-08-01 DIAGNOSIS — J342 Deviated nasal septum: Secondary | ICD-10-CM | POA: Diagnosis not present

## 2023-08-11 DIAGNOSIS — H04123 Dry eye syndrome of bilateral lacrimal glands: Secondary | ICD-10-CM | POA: Diagnosis not present

## 2023-08-11 DIAGNOSIS — H40013 Open angle with borderline findings, low risk, bilateral: Secondary | ICD-10-CM | POA: Diagnosis not present

## 2023-08-11 DIAGNOSIS — H524 Presbyopia: Secondary | ICD-10-CM | POA: Diagnosis not present

## 2023-08-11 DIAGNOSIS — H25813 Combined forms of age-related cataract, bilateral: Secondary | ICD-10-CM | POA: Diagnosis not present

## 2023-08-11 DIAGNOSIS — H353132 Nonexudative age-related macular degeneration, bilateral, intermediate dry stage: Secondary | ICD-10-CM | POA: Diagnosis not present

## 2023-10-07 DIAGNOSIS — Z87891 Personal history of nicotine dependence: Secondary | ICD-10-CM | POA: Diagnosis not present

## 2023-10-07 DIAGNOSIS — Z91040 Latex allergy status: Secondary | ICD-10-CM | POA: Diagnosis not present

## 2023-10-07 DIAGNOSIS — W208XXA Other cause of strike by thrown, projected or falling object, initial encounter: Secondary | ICD-10-CM | POA: Diagnosis not present

## 2023-10-07 DIAGNOSIS — Z7983 Long term (current) use of bisphosphonates: Secondary | ICD-10-CM | POA: Diagnosis not present

## 2023-10-07 DIAGNOSIS — I10 Essential (primary) hypertension: Secondary | ICD-10-CM | POA: Diagnosis not present

## 2023-10-07 DIAGNOSIS — S99921A Unspecified injury of right foot, initial encounter: Secondary | ICD-10-CM | POA: Diagnosis not present

## 2023-10-07 DIAGNOSIS — S90811A Abrasion, right foot, initial encounter: Secondary | ICD-10-CM | POA: Diagnosis not present

## 2023-10-07 DIAGNOSIS — J449 Chronic obstructive pulmonary disease, unspecified: Secondary | ICD-10-CM | POA: Diagnosis not present

## 2023-10-07 DIAGNOSIS — Z7722 Contact with and (suspected) exposure to environmental tobacco smoke (acute) (chronic): Secondary | ICD-10-CM | POA: Diagnosis not present

## 2023-10-08 DIAGNOSIS — S99921A Unspecified injury of right foot, initial encounter: Secondary | ICD-10-CM | POA: Diagnosis not present

## 2023-10-14 DIAGNOSIS — M79671 Pain in right foot: Secondary | ICD-10-CM | POA: Diagnosis not present

## 2023-10-14 DIAGNOSIS — I10 Essential (primary) hypertension: Secondary | ICD-10-CM | POA: Diagnosis not present

## 2023-10-14 DIAGNOSIS — Z299 Encounter for prophylactic measures, unspecified: Secondary | ICD-10-CM | POA: Diagnosis not present

## 2023-12-04 DIAGNOSIS — I69351 Hemiplegia and hemiparesis following cerebral infarction affecting right dominant side: Secondary | ICD-10-CM | POA: Diagnosis not present

## 2023-12-04 DIAGNOSIS — I10 Essential (primary) hypertension: Secondary | ICD-10-CM | POA: Diagnosis not present

## 2023-12-04 DIAGNOSIS — I779 Disorder of arteries and arterioles, unspecified: Secondary | ICD-10-CM | POA: Diagnosis not present

## 2023-12-04 DIAGNOSIS — Z299 Encounter for prophylactic measures, unspecified: Secondary | ICD-10-CM | POA: Diagnosis not present

## 2023-12-04 DIAGNOSIS — I700 Atherosclerosis of aorta: Secondary | ICD-10-CM | POA: Diagnosis not present

## 2023-12-04 DIAGNOSIS — R569 Unspecified convulsions: Secondary | ICD-10-CM | POA: Diagnosis not present

## 2024-01-15 DIAGNOSIS — S72001D Fracture of unspecified part of neck of right femur, subsequent encounter for closed fracture with routine healing: Secondary | ICD-10-CM | POA: Diagnosis not present

## 2024-03-15 DIAGNOSIS — I10 Essential (primary) hypertension: Secondary | ICD-10-CM | POA: Diagnosis not present

## 2024-03-15 DIAGNOSIS — Z1331 Encounter for screening for depression: Secondary | ICD-10-CM | POA: Diagnosis not present

## 2024-03-15 DIAGNOSIS — I700 Atherosclerosis of aorta: Secondary | ICD-10-CM | POA: Diagnosis not present

## 2024-03-15 DIAGNOSIS — Z299 Encounter for prophylactic measures, unspecified: Secondary | ICD-10-CM | POA: Diagnosis not present

## 2024-03-15 DIAGNOSIS — F32A Depression, unspecified: Secondary | ICD-10-CM | POA: Diagnosis not present

## 2024-03-15 DIAGNOSIS — Z7189 Other specified counseling: Secondary | ICD-10-CM | POA: Diagnosis not present

## 2024-03-15 DIAGNOSIS — Z87891 Personal history of nicotine dependence: Secondary | ICD-10-CM | POA: Diagnosis not present

## 2024-03-15 DIAGNOSIS — Z1339 Encounter for screening examination for other mental health and behavioral disorders: Secondary | ICD-10-CM | POA: Diagnosis not present

## 2024-03-15 DIAGNOSIS — Z0000 Encounter for general adult medical examination without abnormal findings: Secondary | ICD-10-CM | POA: Diagnosis not present

## 2024-03-15 DIAGNOSIS — J449 Chronic obstructive pulmonary disease, unspecified: Secondary | ICD-10-CM | POA: Diagnosis not present

## 2024-07-07 DIAGNOSIS — R5383 Other fatigue: Secondary | ICD-10-CM | POA: Diagnosis not present

## 2024-07-07 DIAGNOSIS — Z79899 Other long term (current) drug therapy: Secondary | ICD-10-CM | POA: Diagnosis not present

## 2024-07-07 DIAGNOSIS — I69351 Hemiplegia and hemiparesis following cerebral infarction affecting right dominant side: Secondary | ICD-10-CM | POA: Diagnosis not present

## 2024-07-07 DIAGNOSIS — Z125 Encounter for screening for malignant neoplasm of prostate: Secondary | ICD-10-CM | POA: Diagnosis not present

## 2024-07-07 DIAGNOSIS — Z0000 Encounter for general adult medical examination without abnormal findings: Secondary | ICD-10-CM | POA: Diagnosis not present

## 2024-07-07 DIAGNOSIS — E7800 Pure hypercholesterolemia, unspecified: Secondary | ICD-10-CM | POA: Diagnosis not present

## 2024-07-07 DIAGNOSIS — R569 Unspecified convulsions: Secondary | ICD-10-CM | POA: Diagnosis not present

## 2024-07-07 DIAGNOSIS — Z299 Encounter for prophylactic measures, unspecified: Secondary | ICD-10-CM | POA: Diagnosis not present

## 2024-07-07 DIAGNOSIS — I10 Essential (primary) hypertension: Secondary | ICD-10-CM | POA: Diagnosis not present

## 2024-07-19 DIAGNOSIS — R0989 Other specified symptoms and signs involving the circulatory and respiratory systems: Secondary | ICD-10-CM | POA: Diagnosis not present

## 2024-08-12 DIAGNOSIS — H0100A Unspecified blepharitis right eye, upper and lower eyelids: Secondary | ICD-10-CM | POA: Diagnosis not present

## 2024-08-12 DIAGNOSIS — H353132 Nonexudative age-related macular degeneration, bilateral, intermediate dry stage: Secondary | ICD-10-CM | POA: Diagnosis not present

## 2024-08-12 DIAGNOSIS — H0100B Unspecified blepharitis left eye, upper and lower eyelids: Secondary | ICD-10-CM | POA: Diagnosis not present

## 2024-08-12 DIAGNOSIS — H524 Presbyopia: Secondary | ICD-10-CM | POA: Diagnosis not present

## 2024-08-12 DIAGNOSIS — H40013 Open angle with borderline findings, low risk, bilateral: Secondary | ICD-10-CM | POA: Diagnosis not present

## 2024-08-15 NOTE — Progress Notes (Signed)
 Chief Complaint: Abnormal PSA  History of Present Illness:  Miguel Daniels is a 83 y.o. male who is seen in consultation from Rosamond Mon B, MD for evaluation of an elevated PSA level. Drawn last month, the only lab value I have from PCP is 20.9.   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   Thoracic compression fracture (HCC)    TIA (transient ischemic attack) 08/12/2015    Past Surgical History:  Past Surgical History:  Procedure Laterality Date   BIOPSY  08/25/2020   Procedure: BIOPSY;  Surgeon: Eartha Angelia Sieving, MD;  Location: [facility];  Service: Gastroenterology;;   COLONOSCOPY WITH PROPOFOL  N/A 08/25/2020   Procedure: COLONOSCOPY WITH PROPOFOL ;  Surgeon: Eartha Angelia Sieving, MD;  Location: [facility];  Service: Gastroenterology;  Laterality: N/A;  1015   ENDARTERECTOMY Left 1108/2019    ENDARTERECTOMY CAROTID LEFT (Left Neck)   ENDARTERECTOMY Left 09/04/2018   Procedure: ENDARTERECTOMY CAROTID LEFT;  Surgeon: Oris Krystal FALCON, MD;  Location: [facility] OR;  Service: Vascular;  Laterality: Left;   ESOPHAGOGASTRODUODENOSCOPY (EGD) WITH PROPOFOL  N/A 08/25/2020   Procedure: ESOPHAGOGASTRODUODENOSCOPY (EGD) WITH PROPOFOL ;  Surgeon: Eartha Angelia Sieving, MD;  Location: [facility];  Service: Gastroenterology;  Laterality: N/A;   PATCH ANGIOPLASTY  09/04/2018   Procedure: PATCH ANGIOPLASTY LEFT CAROTID ATERY USING HEMASHIELD PLATINUM FINESSE PATCH;  Surgeon: Oris Krystal FALCON, MD;  Location: [facility];  Service: Vascular;;    POLYPECTOMY  08/25/2020   Procedure: POLYPECTOMY;  Surgeon: Eartha Angelia Sieving, MD;  Location: [facility];  Service: Gastroenterology;;   TONSILLECTOMY      Allergies:  Allergies  Allergen Reactions   Latex Hives    Family History:  Family History  Problem Relation Age of Onset   Hypertension Father    High blood pressure Sister     Social History:  Social History   Tobacco Use   Smoking status: Former    Current packs/day: 0.00    Average packs/day: 1 pack/day for 20.0 years (20.0 ttl pk-yrs)    Types: Cigarettes    Start date: 06/15/1959    Quit date: 06/15/1979    Years since quitting: 45.2   Smokeless tobacco: Never  Vaping Use   Vaping status: Never Used  Substance Use Topics   Alcohol  use: No    Alcohol /week: 0.0 standard drinks of alcohol    Drug use: No    Review of symptoms:  Constitutional:  Negative for unexplained weight loss, night sweats, fever, chills ENT:  Negative for nose bleeds, sinus pain, painful swallowing CV:  Negative for chest pain, shortness of breath, exercise intolerance, palpitations, loss of consciousness Resp:  Negative for cough, wheezing, shortness of breath GI:  Negative for nausea, vomiting, diarrhea, bloody stools GU:  Positives noted in HPI; otherwise negative for gross hematuria, dysuria, urinary incontinence Neuro:  Negative for seizures, poor balance, limb weakness, slurred speech Psych:  Negative for lack of energy, depression, anxiety Endocrine:  Negative for polydipsia, polyuria, symptoms of hypoglycemia (dizziness, hunger, sweating) Hematologic:  Negative for anemia, purpura, petechia, prolonged or excessive bleeding, use of anticoagulants  Allergic:  Negative for difficulty breathing or choking as a result of exposure to anything; no shellfish allergy; no allergic response (rash/itch) to materials, foods  Physical exam: There were no vitals taken for this visit. GENERAL APPEARANCE:  Well appearing, well  developed, well nourished, NAD HEENT: Atraumatic, Normocephalic. NECK: Normal appearance LUNGS: Normal inspiratory and expiratory excursion HEART: Regular Rate ABDOMEN: ***. GU: Phallus normal, no lesions. Scrotal skin normal. Testicles/epididymal structures normal. Meatus normal. Normal anal sphincter tone, prostate ***mL, symmetric, non nodular, non tender. EXTREMITIES: Moves all extremities well.  Without clubbing, cyanosis, or edema. NEUROLOGIC:  Alert and oriented x 3, normal gait, CN II-XII grossly intact.  MENTAL STATUS:  Appropriate. SKIN:  Warm, dry and intact.    Results:  I have reviewed referring/prior physicians notes--17 pages reviewed  I have reviewed urinalysis  I have reviewed PSA results  I have reviewed prior imaging  I have reviewed urine culture results  Assessment: ***   Plan: ***

## 2024-08-16 ENCOUNTER — Ambulatory Visit: Admitting: Urology

## 2024-08-16 VITALS — BP 174/94 | HR 86 | Ht 74.0 in | Wt 138.0 lb

## 2024-08-16 DIAGNOSIS — R972 Elevated prostate specific antigen [PSA]: Secondary | ICD-10-CM

## 2024-08-16 LAB — BLADDER SCAN AMB NON-IMAGING: Scan Result: 0

## 2024-08-31 NOTE — Procedures (Signed)
 TRANSRECTAL ULTRASOUND AND PROSTATE BIOPSY  Indication:  {TRUSBX indications:26398}  Prophylactic antibiotic administration: {TRUS antibiotics:26399}  All medications that could result in increased bleeding were discontinued within an appropriate period of the time of biopsy.  Risk including bleeding and infection were discussed.  Informed consent was obtained.  The patient was placed in the left lateral decubitus position.  PROCEDURE 1.  TRANSRECTAL ULTRASOUND OF THE PROSTATE  The 7 MHz transrectal probe was used to image the prostate.  Anal stenosis {WAS/WAS NOT:[phone]::was not} noted.  TRUS volume: *** ml  Hypoechoic areas: {prostate anatomy:26400}  Hyperechoic areas: {prostate anatomy:26400}  Central calcifications: {DESC; PRESENT/NOT PRESENT:21021351}  Margins:  {Normal/Abnormal Appearance:21344::normal}  Seminal Vesicles: {Normal/Abnormal Appearance:21344::normal}   PROCEDURE 2:  PROSTATE BIOPSY  A periprostatic block was performed using 1% lidocaine  and transrectal ultrasound guidance. Under transrectal ultrasound guidance, and using the Biopty gun, prostate biopsies were obtained systematically from the apex, mid gland, and base bilaterally.  A total of *** cores were obtained.  Hemostasis was obtained with gentle pressure on the prostate.  The procedures were well-tolerated.  No significant bleeding was noted at the end of the procedure.  The patient was stable for discharge from the office.

## 2024-09-01 ENCOUNTER — Ambulatory Visit (HOSPITAL_BASED_OUTPATIENT_CLINIC_OR_DEPARTMENT_OTHER)
Admission: RE | Admit: 2024-09-01 | Discharge: 2024-09-01 | Disposition: A | Discharge status: Home / Self Care | Source: Ambulatory Visit | Attending: Urology | Admitting: Urology

## 2024-09-01 ENCOUNTER — Other Ambulatory Visit (HOSPITAL_BASED_OUTPATIENT_CLINIC_OR_DEPARTMENT_OTHER): Payer: Self-pay | Admitting: Urology

## 2024-09-01 ENCOUNTER — Ambulatory Visit (INDEPENDENT_AMBULATORY_CARE_PROVIDER_SITE_OTHER): Admitting: Urology

## 2024-09-01 VITALS — BP 168/85 | HR 84 | Ht 74.0 in | Wt 164.0 lb

## 2024-09-01 DIAGNOSIS — C61 Malignant neoplasm of prostate: Secondary | ICD-10-CM | POA: Insufficient documentation

## 2024-09-01 DIAGNOSIS — R972 Elevated prostate specific antigen [PSA]: Secondary | ICD-10-CM | POA: Diagnosis present

## 2024-09-01 DIAGNOSIS — Z2989 Encounter for other specified prophylactic measures: Secondary | ICD-10-CM

## 2024-09-01 LAB — URINALYSIS, ROUTINE W REFLEX MICROSCOPIC
Bilirubin, UA: NEGATIVE
Glucose, UA: NEGATIVE
Ketones, UA: NEGATIVE
Leukocytes,UA: NEGATIVE
Nitrite, UA: NEGATIVE
Protein,UA: NEGATIVE
Specific Gravity, UA: 1.025 (ref 1.005–1.030)
Urobilinogen, Ur: 0.2 mg/dL (ref 0.2–1.0)
pH, UA: 5.5 (ref 5.0–7.5)

## 2024-09-01 LAB — MICROSCOPIC EXAMINATION

## 2024-09-01 MED ORDER — CEFTRIAXONE SODIUM 500 MG IJ SOLR
1.0000 g | Freq: Once | INTRAMUSCULAR | Status: AC
  Administered 2024-09-01: 1 g via INTRAMUSCULAR

## 2024-09-01 NOTE — Addendum Note (Signed)
 Addended by: KOLEEN MITZIE BROCKS on: 09/01/2024 08:43 AM   Modules accepted: Orders

## 2024-09-01 NOTE — Progress Notes (Signed)
 IM Injection  Patient is present today for an IM Injection for treatment of infection prevention post prostate biopsy Drug: Ceftriaxone Dose:1g Location:left outter buttock  Lot: [id number] Exp:03/2026 Patient tolerated well, no complications were noted  Performed by: C. Koleen, LPN

## 2024-09-06 ENCOUNTER — Encounter: Payer: Self-pay | Admitting: Urology

## 2024-09-06 LAB — PROSTATE CORE NEEDLE BIOPSY

## 2024-09-07 ENCOUNTER — Other Ambulatory Visit: Payer: Self-pay | Admitting: Urology

## 2024-09-07 DIAGNOSIS — C61 Malignant neoplasm of prostate: Secondary | ICD-10-CM

## 2024-09-30 ENCOUNTER — Encounter (HOSPITAL_COMMUNITY)
Admission: RE | Admit: 2024-09-30 | Discharge: 2024-09-30 | Disposition: A | Source: Ambulatory Visit | Attending: Urology | Admitting: Urology

## 2024-09-30 DIAGNOSIS — C61 Malignant neoplasm of prostate: Secondary | ICD-10-CM

## 2024-09-30 MED ORDER — FLOTUFOLASTAT F 18 GALLIUM 296-5846 MBQ/ML IV SOLN
8.7800 | Freq: Once | INTRAVENOUS | Status: AC
  Administered 2024-09-30: 8.78 via INTRAVENOUS
  Filled 2024-09-30: qty 9

## 2024-10-05 ENCOUNTER — Other Ambulatory Visit: Payer: Self-pay | Admitting: Urology

## 2024-10-05 ENCOUNTER — Ambulatory Visit: Payer: Self-pay | Admitting: Urology

## 2024-10-05 DIAGNOSIS — C61 Malignant neoplasm of prostate: Secondary | ICD-10-CM

## 2024-11-02 NOTE — Progress Notes (Signed)
 Miguel Daniels                                          MRN: [id number]   11/02/2024   The [facility] Quality Team Specialist reviewed this patient medical record for the purposes of chart review for care gap closure. The following were reviewed: chart review for care gap closure-controlling blood pressure.    [facility] Quality Team
